# Patient Record
Sex: Male | Born: 1995 | Race: Black or African American | Hispanic: No | Marital: Single | Smoking: Never smoker
Health system: Southern US, Community
[De-identification: ages and names within clinical notes are randomized; demographics above are authoritative.]

## PROBLEM LIST (undated history)

## (undated) DIAGNOSIS — R51 Headache: Secondary | ICD-10-CM

## (undated) DIAGNOSIS — R519 Headache, unspecified: Secondary | ICD-10-CM

## (undated) DIAGNOSIS — M109 Gout, unspecified: Secondary | ICD-10-CM

---

## 2014-03-21 ENCOUNTER — Emergency Department (HOSPITAL_COMMUNITY)
Admission: EM | Admit: 2014-03-21 | Discharge: 2014-03-21 | Disposition: A | Discharge status: Home / Self Care | Payer: Federal, State, Local not specified - PPO | Source: Home / Self Care | Attending: Family Medicine | Admitting: Family Medicine

## 2014-03-21 ENCOUNTER — Encounter (HOSPITAL_COMMUNITY): Payer: Self-pay | Admitting: Emergency Medicine

## 2014-03-21 DIAGNOSIS — J029 Acute pharyngitis, unspecified: Secondary | ICD-10-CM

## 2014-03-21 LAB — POCT RAPID STREP A: Streptococcus, Group A Screen (Direct): NEGATIVE

## 2014-03-21 MED ORDER — CLINDAMYCIN HCL 300 MG PO CAPS: 300.0000 mg | ORAL_CAPSULE | Freq: Three times a day (TID) | ORAL | Status: DC

## 2014-03-21 NOTE — Discharge Instructions (Signed)
Drink lots of fluids, take all of medicine, use lozenges as needed.return if needed °

## 2014-03-21 NOTE — ED Provider Notes (Signed)
CSN: [id number]     Arrival date & time 03/21/14  1959 History   First MD Initiated Contact with Patient 03/21/14 2041     Chief Complaint  Patient presents with  . Sore Throat   (Consider location/radiation/quality/duration/timing/severity/associated sxs/prior Treatment) Patient is a 18 y.o. male presenting with pharyngitis. The history is provided by the patient.  Sore Throat This is a new problem. The current episode started 2 days ago. The problem has been gradually worsening. Pertinent negatives include no chest pain and no abdominal pain. The symptoms are aggravated by swallowing.    History reviewed. No pertinent past medical history. History reviewed. No pertinent past surgical history. History reviewed. No pertinent family history. History  Substance Use Topics  . Smoking status: Never Smoker   . Smokeless tobacco: Not on file  . Alcohol Use: No    Review of Systems  Constitutional: Negative.   HENT: Positive for sore throat.   Cardiovascular: Negative for chest pain.  Gastrointestinal: Negative for abdominal pain.  Musculoskeletal: Positive for myalgias.  Hematological: Positive for adenopathy.    Allergies  Review of patient's allergies indicates no known allergies.  Home Medications   Current Outpatient Rx  Name  Route  Sig  Dispense  Refill  . clindamycin (CLEOCIN) 300 MG capsule   Oral   Take 1 capsule (300 mg total) by mouth 3 (three) times daily.   28 capsule   0    There were no vitals taken for this visit. Physical Exam  Nursing note and vitals reviewed. Constitutional: He is oriented to person, place, and time. He appears well-developed and well-nourished. No distress.  HENT:  Right Ear: External ear normal.  Left Ear: External ear normal.  Mouth/Throat: Uvula is midline and mucous membranes are normal. Oropharyngeal exudate and posterior oropharyngeal erythema present.  Eyes: Conjunctivae are normal. Pupils are equal, round, and reactive to  light.  Neck: Normal range of motion. Neck supple.  Cardiovascular: Normal rate, regular rhythm and normal heart sounds.   Pulmonary/Chest: Effort normal and breath sounds normal.  Lymphadenopathy:    He has cervical adenopathy.  Neurological: He is alert and oriented to person, place, and time.  Skin: Skin is warm and dry.    ED Course  Procedures (including critical care time) Labs Review Labs Reviewed  POCT RAPID STREP A ([facility] ONLY)   Imaging Review No results found.   MDM   1. Pharyngitis, acute        Linna HoffJames D Chelly Dombeck, MD 03/21/14 2109

## 2014-03-21 NOTE — ED Notes (Addendum)
Exudative,red ,swollen tonsils, HA, back pain, body aches; minimal relief w motrin

## 2014-03-23 LAB — CULTURE, GROUP A STREP

## 2014-03-26 ENCOUNTER — Emergency Department (INDEPENDENT_AMBULATORY_CARE_PROVIDER_SITE_OTHER)
Admission: EM | Admit: 2014-03-26 | Discharge: 2014-03-26 | Disposition: A | Discharge status: Home / Self Care | Payer: Federal, State, Local not specified - PPO | Source: Home / Self Care

## 2014-03-26 ENCOUNTER — Encounter (HOSPITAL_COMMUNITY): Payer: Self-pay | Admitting: Emergency Medicine

## 2014-03-26 DIAGNOSIS — J039 Acute tonsillitis, unspecified: Secondary | ICD-10-CM

## 2014-03-26 DIAGNOSIS — B2790 Infectious mononucleosis, unspecified without complication: Secondary | ICD-10-CM

## 2014-03-26 LAB — POCT RAPID STREP A: STREPTOCOCCUS, GROUP A SCREEN (DIRECT): NEGATIVE

## 2014-03-26 LAB — POCT INFECTIOUS MONO SCREEN: MONO SCREEN: POSITIVE — AB

## 2014-03-26 MED ORDER — ACETAMINOPHEN-CODEINE #3 300-30 MG PO TABS: 1.0000 | ORAL_TABLET | Freq: Four times a day (QID) | ORAL | Status: DC | PRN

## 2014-03-26 MED ORDER — CEFTRIAXONE SODIUM 1 G IJ SOLR
1.0000 g | Freq: Once | INTRAMUSCULAR | Status: AC
  Administered 2014-03-26: 1 g via INTRAMUSCULAR

## 2014-03-26 MED ORDER — LIDOCAINE HCL (PF) 1 % IJ SOLN
INTRAMUSCULAR | Status: AC
  Filled 2014-03-26: qty 5

## 2014-03-26 MED ORDER — METHYLPREDNISOLONE 4 MG PO KIT: PACK | ORAL | Status: DC

## 2014-03-26 MED ORDER — CEFTRIAXONE SODIUM 1 G IJ SOLR
INTRAMUSCULAR | Status: AC
  Filled 2014-03-26: qty 10

## 2014-03-26 NOTE — ED Notes (Addendum)
Pt c/o persistent sore throat Seen here on 4/6; given Clindamycin w/no relief; tolerating well Sx include HA, night sweats, ear pain, diarrhea abd pain, white patches on bilat tonsils Denies f/v/d Alert w/no signs of acute distress.

## 2014-03-26 NOTE — ED Provider Notes (Signed)
Medical screening examination/treatment/procedure(s) were performed by a resident physician or non-physician practitioner and as the supervising physician I was immediately available for consultation/collaboration.  Darell Saputo, MD    Samson Ralph S Melora Menon, MD 03/26/14 2033 

## 2014-03-26 NOTE — ED Provider Notes (Signed)
CSN: [id number]     Arrival date & time 03/26/14  4098 History   First MD Initiated Contact with Patient 03/26/14 1120     Chief Complaint  Patient presents with  . Sore Throat   (Consider location/radiation/quality/duration/timing/severity/associated sxs/prior Treatment) HPI Comments: 18 year old male was seen in urgent care on April 6 20/15 with daily pharyngitis. History of test was negative and knee following culture was negative for beta hemolytic strep. He was treated with clindamycin and the patient states he has been compliant with 300 mg 3 times a day. He states the throat feels worse and the white patches are getting larger. He is able to swallow but it is painful. No vomiting or airway obstruction.   History reviewed. No pertinent past medical history. History reviewed. No pertinent past surgical history. No family history on file. History  Substance Use Topics  . Smoking status: Never Smoker   . Smokeless tobacco: Not on file  . Alcohol Use: No    Review of Systems  Constitutional: Positive for fever, activity change and appetite change.  HENT: Positive for sore throat and trouble swallowing.   Respiratory: Negative for cough, shortness of breath and wheezing.   Cardiovascular: Negative.   Gastrointestinal: Negative.   Skin: Negative for rash.    Allergies  Review of patient's allergies indicates no known allergies.  Home Medications   Current Outpatient Rx  Name  Route  Sig  Dispense  Refill  . clindamycin (CLEOCIN) 300 MG capsule   Oral   Take 1 capsule (300 mg total) by mouth 3 (three) times daily.   28 capsule   0   . methylPREDNISolone (MEDROL DOSEPAK) 4 MG tablet      follow package directions   21 tablet   0    BP 147/93  Pulse 88  Temp(Src) 99.7 F (37.6 C) (Oral)  Resp 18  SpO2 98% Physical Exam  Nursing note and vitals reviewed. Constitutional: He is oriented to person, place, and time. He appears well-developed and well-nourished. No  distress.  HENT:  Mouth/Throat: Oropharyngeal exudate present.  Large, beefy red bilateral palatine tonsils. Both are covered with large thick patches of dried exudate. Soft palate without edema, uvula normal size and is midline. No shifting of intraoral or pharyngeal structures. Soft palate rises symmetrically. Airway is widely patent. No visible signs of para or retropharyngeal abscess formation.  Neck: Normal range of motion. Neck supple.  Bilateral anterior cervical chain adenopathy.  Pulmonary/Chest: Effort normal. No respiratory distress.  Lymphadenopathy:    He has cervical adenopathy.  Neurological: He is alert and oriented to person, place, and time. He exhibits normal muscle tone.  Skin: Skin is warm and dry. No rash noted.  Psychiatric: He has a normal mood and affect.    ED Course  Procedures (including critical care time) Labs Review Labs Reviewed  POCT RAPID STREP A ([facility] ONLY)   Imaging Review No results found. Results for orders placed during the hospital encounter of 03/26/14  POCT RAPID STREP A ([facility] ONLY)      Result Value Ref Range   Streptococcus, Group A Screen (Direct) NEGATIVE  NEGATIVE   Results for orders placed during the hospital encounter of 03/26/14  POCT RAPID STREP A ([facility] ONLY)      Result Value Ref Range   Streptococcus, Group A Screen (Direct) NEGATIVE  NEGATIVE  POCT INFECTIOUS MONO SCREEN      Result Value Ref Range   Mono Screen  POSITIVE (*) NEGATIVE     MDM   1. Tonsillitis with exudate    monospot Consulted with Dr. Denyse Amassorey. Medrol dose pack Rocephin 1 gm IM Cont the clindamycin tid til all gone. Plenty of liquids. Return or go to the Ed if worse.    Hayden Rasmussenavid Karlo Goeden, NP 03/26/14 1151  Hayden Rasmussenavid Hubbert Landrigan, NP 03/26/14 1215  Hayden Rasmussenavid Leoda Smithhart, NP 03/26/14 [phone]

## 2014-03-26 NOTE — Discharge Instructions (Signed)
Pharyngitis Pharyngitis is redness, pain, and swelling (inflammation) of your pharynx.  CAUSES  Pharyngitis is usually caused by infection. Most of the time, these infections are from viruses (viral) and are part of a cold. However, sometimes pharyngitis is caused by bacteria (bacterial). Pharyngitis can also be caused by allergies. Viral pharyngitis may be spread from person to person by coughing, sneezing, and personal items or utensils (cups, forks, spoons, toothbrushes). Bacterial pharyngitis may be spread from person to person by more intimate contact, such as kissing.  SIGNS AND SYMPTOMS  Symptoms of pharyngitis include:   Sore throat.   Tiredness (fatigue).   Low-grade fever.   Headache.  Joint pain and muscle aches.  Skin rashes.  Swollen lymph nodes.  Plaque-like film on throat or tonsils (often seen with bacterial pharyngitis). DIAGNOSIS  Your health care provider will ask you questions about your illness and your symptoms. Your medical history, along with a physical exam, is often all that is needed to diagnose pharyngitis. Sometimes, a rapid strep test is done. Other lab tests may also be done, depending on the suspected cause.  TREATMENT  Viral pharyngitis will usually get better in 3 4 days without the use of medicine. Bacterial pharyngitis is treated with medicines that kill germs (antibiotics).  HOME CARE INSTRUCTIONS   Drink enough water and fluids to keep your urine clear or pale yellow.   Only take over-the-counter or prescription medicines as directed by your health care provider:   If you are prescribed antibiotics, make sure you finish them even if you start to feel better.   Do not take aspirin.   Get lots of rest.   Gargle with 8 oz of salt water ( tsp of salt per 1 qt of water) as often as every 1 2 hours to soothe your throat.   Throat lozenges (if you are not at risk for choking) or sprays may be used to soothe your throat. SEEK MEDICAL  CARE IF:   You have large, tender lumps in your neck.  You have a rash.  You cough up [place], yellow-brown, or bloody spit. SEEK IMMEDIATE MEDICAL CARE IF:   Your neck becomes stiff.  You drool or are unable to swallow liquids.  You vomit or are unable to keep medicines or liquids down.  You have severe pain that does not go away with the use of recommended medicines.  You have trouble breathing (not caused by a stuffy nose). MAKE SURE YOU:   Understand these instructions.  Will watch your condition.  Will get help right away if you are not doing well or get worse. Document Released: 12/02/2005 Document Revised: 09/22/2013 Document Reviewed: 08/09/2013 [facility] Patient Information 2014 [place].  Tonsillitis Tonsillitis is an infection of the throat that causes the tonsils to become red, tender, and swollen. Tonsils are collections of lymphoid tissue at the back of the throat. Each tonsil has crevices (crypts). Tonsils help fight nose and throat infections and keep infection from spreading to other parts of the body for the first 18 months of life.  CAUSES Sudden (acute) tonsillitis is usually caused by infection with streptococcal bacteria. Long-lasting (chronic) tonsillitis occurs when the crypts of the tonsils become filled with pieces of food and bacteria, which makes it easy for the tonsils to become repeatedly infected. SYMPTOMS  Symptoms of tonsillitis include:  A sore throat, with possible difficulty swallowing.  White patches on the tonsils.  Fever.  Tiredness.  New episodes of snoring during sleep, when you did  not snore before.  Small, foul-smelling, yellowish-white pieces of material (tonsilloliths) that you occasionally cough up or spit out. The tonsilloliths can also cause you to have bad breath. DIAGNOSIS Tonsillitis can be diagnosed through a physical exam. Diagnosis can be confirmed with the results of lab tests, including a throat  culture. TREATMENT  The goals of tonsillitis treatment include the reduction of the severity and duration of symptoms and prevention of associated conditions. Symptoms of tonsillitis can be improved with the use of steroids to reduce the swelling. Tonsillitis caused by bacteria can be treated with antibiotics. Usually, treatment with antibiotics is started before the cause of the tonsillitis is known. However, if it is determined that the cause is not bacterial, antibiotics will not treat the tonsillitis. If attacks of tonsillitis are severe and frequent, your caregiver may recommend surgery to remove the tonsils (tonsillectomy). HOME CARE INSTRUCTIONS   Rest as much as possible and get plenty of sleep.  Drink plenty of fluids. While the throat is very sore, eat soft foods or liquids, such as sherbet, soups, or instant breakfast drinks.  Eat frozen ice pops.  Gargle with a warm or cold liquid to help soothe the throat. Mix 1/4 teaspoon of salt and 1/4 teaspoon of baking soda in in 8 oz of water. SEEK MEDICAL CARE IF:   Large, tender lumps develop in your neck.  A rash develops.  A [place], yellow-brown, or bloody substance is coughed up.  You are unable to swallow liquids or food for 24 hours.  You notice that only one of the tonsils is swollen. SEEK IMMEDIATE MEDICAL CARE IF:   You develop any new symptoms such as vomiting, severe headache, stiff neck, chest pain, or trouble breathing or swallowing.  You have severe throat pain along with drooling or voice changes.  You have severe pain, unrelieved with recommended medications.  You are unable to fully open the mouth.  You develop redness, swelling, or severe pain anywhere in the neck.  You have a fever. MAKE SURE YOU:   Understand these instructions.  Will watch your condition.  Will get help right away if you are not doing well or get worse. Document Released: 09/11/2005 Document Revised: 08/04/2013 Document Reviewed:  05/21/2013 [facility] Patient Information 2014 [place]  Infectious Mononucleosis Infectious mononucleosis (mono) is a common germ (viral) infection in children, teenagers, and young adults.  CAUSES  Mono is an infection caused by the Malachi CarlEpstein Barr virus. The virus is spread by close personal contact with someone who has the infection. It can be passed by contact with your saliva through things such as kissing or sharing drinking glasses. Sometimes, the infection can be spread from someone who does not appear sick but still spreads the virus (asymptomatic carrier state).  SYMPTOMS  The most common symptoms of Mono are:  Sore throat.  Headache.  Fatigue.  Muscle aches.  Swollen glands.  Fever.  Poor appetite.  Enlarged liver or spleen. The less common symptoms can include:  Rash.  Feeling sick to your stomach (nauseous).  Abdominal pain. DIAGNOSIS  Mono is diagnosed by a blood test.  TREATMENT  Treatment of mono is usually at home. There is no medicine that cures this virus. Sometimes hospital treatment is needed in severe cases. Steroid medicine sometimes is needed if the swelling in the throat causes breathing or swallowing problems.  HOME CARE INSTRUCTIONS   Drink enough fluids to keep your urine clear or pale yellow.  Eat soft foods. Cool foods like popsicles  or ice cream can soothe a sore throat.  Only take over-the-counter or prescription medicines for pain, discomfort, or fever as directed by your caregiver. Children under 27 years of age should not take aspirin.  Gargle salt water. This may help relieve your sore throat. Put 1 teaspoon (tsp) of salt in 1 cup of warm water. Sucking on hard candy may also help.  Rest as needed.  Start regular activities gradually after the fever is gone. Be sure to rest when tired.  Avoid strenuous exercise or contact sports until your caregiver says it is okay. The liver and spleen could be seriously injured.  Avoid  sharing drinking glasses or kissing until your caregiver tells you that you are no longer contagious. SEEK MEDICAL CARE IF:   Your fever is not gone after 7 days.  Your activity level is not back to normal after 2 weeks.  You have yellow coloring to eyes and skin (jaundice). SEEK IMMEDIATE MEDICAL CARE IF:   You have severe pain in the abdomen or shoulder.  You have trouble swallowing or drooling.  You have trouble breathing.  You develop a stiff neck.  You develop a severe headache.  You cannot stop throwing up (vomiting).  You have convulsions.  You are confused.  You have trouble with balance.  You develop signs of body fluid loss (dehydration):  Weakness.  Sunken eyes.  Pale skin.  Dry mouth.  Rapid breathing or pulse. MAKE SURE YOU: No contact sports or similar activity for next 2 months, then recheck with your doctor  Understand these instructions.  Will watch your condition.  Will get help right away if you are not doing well or get worse. Document Released: 11/29/2000 Document Revised: 02/24/2012 Document Reviewed: 09/27/2008 [facility] Patient Information 2014 [place], [place].

## 2014-03-28 LAB — CULTURE, GROUP A STREP

## 2014-11-15 ENCOUNTER — Emergency Department (INDEPENDENT_AMBULATORY_CARE_PROVIDER_SITE_OTHER)
Admission: EM | Admit: 2014-11-15 | Discharge: 2014-11-15 | Disposition: A | Discharge status: Home / Self Care | Payer: Federal, State, Local not specified - PPO | Source: Home / Self Care | Attending: Family Medicine | Admitting: Family Medicine

## 2014-11-15 ENCOUNTER — Encounter (HOSPITAL_COMMUNITY): Payer: Self-pay | Admitting: Emergency Medicine

## 2014-11-15 DIAGNOSIS — K602 Anal fissure, unspecified: Secondary | ICD-10-CM

## 2014-11-15 DIAGNOSIS — K629 Disease of anus and rectum, unspecified: Secondary | ICD-10-CM

## 2014-11-15 MED ORDER — DILTIAZEM GEL 2 %: 1.0000 "application " | Freq: Two times a day (BID) | CUTANEOUS | Status: DC

## 2014-11-15 NOTE — ED Notes (Signed)
C/o having pain with BM.   Normal BM.  Noticed bright red blood with wiping.  Mild swelling.

## 2014-11-15 NOTE — Discharge Instructions (Signed)
Thank you for coming in today. Apply the cream twice daily Follow-up with gastroenterology   Anal Fissure, Adult An anal fissure is a small tear or crack in the skin around the anus. Bleeding from a fissure usually stops on its own within a few minutes. However, bleeding will often reoccur with each bowel movement until the crack heals.  CAUSES   Passing large, hard stools.  Frequent diarrheal stools.  Constipation.  Inflammatory bowel disease (Crohn's disease or ulcerative colitis).  Infections.  Anal sex. SYMPTOMS   Small amounts of blood seen on your stools, on toilet paper, or in the toilet after a bowel movement.  Rectal bleeding.  Painful bowel movements.  Itching or irritation around the anus. DIAGNOSIS Your caregiver will examine the anal area. An anal fissure can usually be seen with careful inspection. A rectal exam may be performed and a short tube (anoscope) may be used to examine the anal canal. TREATMENT   You may be instructed to take fiber supplements. These supplements can soften your stool to help make bowel movements easier.  Sitz baths may be recommended to help heal the tear. Do not use soap in the sitz baths.  A medicated cream or ointment may be prescribed to lessen discomfort. HOME CARE INSTRUCTIONS   Maintain a diet high in fruits, whole grains, and vegetables. Avoid constipating foods like bananas and dairy products.  Take sitz baths as directed by your caregiver.  Drink enough fluids to keep your urine clear or pale yellow.  Only take over-the-counter or prescription medicines for pain, discomfort, or fever as directed by your caregiver. Do not take aspirin as this may increase bleeding.  Do not use ointments containing numbing medications (anesthetics) or hydrocortisone. They could slow healing. SEEK MEDICAL CARE IF:   Your fissure is not completely healed within 3 days.  You have further bleeding.  You have a fever.  You have  diarrhea mixed with blood.  You have pain.  Your problem is getting worse rather than better. MAKE SURE YOU:   Understand these instructions.  Will watch your condition.  Will get help right away if you are not doing well or get worse. Document Released: 12/02/2005 Document Revised: 02/24/2012 Document Reviewed: 05/19/2011 [facility] Patient Information 2015 [place], [place]. This information is not intended to replace advice given to you by your health care provider. Make sure you discuss any questions you have with your health care provider.

## 2014-11-15 NOTE — ED Provider Notes (Signed)
Dylan [name] is a 18 y.o. male who presents to Urgent Care today for rectal pain. Patient has had a multiple month history of pain with bowel movements and with rectal bleeding. He notes bright red blood per rectum. He denies any fevers or chills or weight loss. No vomiting or diarrhea. He has not tried any medications yet. He feels well otherwise.   History reviewed. No pertinent past medical history. History reviewed. No pertinent past surgical history. History  Substance Use Topics  . Smoking status: Never Smoker   . Smokeless tobacco: Not on file  . Alcohol Use: No   ROS as above Medications: No current facility-administered medications for this encounter.   Current Outpatient Prescriptions  Medication Sig Dispense Refill  . diltiazem 2 % GEL Apply 1 application topically 2 (two) times daily. 60 g 1   Allergies  Allergen Reactions  . Clindamycin/Lincomycin Hives     Exam:  BP 147/89 mmHg  Pulse 65  Temp(Src) 98.6 F (37 C) (Oral)  Resp 12  SpO2 98% Gen: Well NAD HEENT: EOMI,  MMM Lungs: Normal work of breathing. CTABL Heart: RRR no MRG Abd: NABS, Soft. Nondistended, Nontender Exts: Brisk capillary refill, warm and well perfused.  Rectum: Normal-appearing anus. Anoscope shows a small 6:00 rectal fissure. Otherwise normal.  No results found for this or any previous visit (from the past 24 hour(s)). No results found.  Assessment and Plan: 18 y.o. male with anal fissure. Treatment with diltiazem gel. Follow up with gastroenterology.  Discussed warning signs or symptoms. Please see discharge instructions. Patient expresses understanding.     Rodolph BongEvan S Shawndell Varas, MD 11/15/14 [phone]

## 2016-04-30 ENCOUNTER — Ambulatory Visit (INDEPENDENT_AMBULATORY_CARE_PROVIDER_SITE_OTHER): Payer: Federal, State, Local not specified - PPO | Admitting: Family Medicine

## 2016-04-30 VITALS — BP 112/72 | HR 63 | Temp 97.9°F | Resp 16 | Ht 67.75 in | Wt 134.8 lb

## 2016-04-30 DIAGNOSIS — Z1329 Encounter for screening for other suspected endocrine disorder: Secondary | ICD-10-CM

## 2016-04-30 DIAGNOSIS — Z111 Encounter for screening for respiratory tuberculosis: Secondary | ICD-10-CM

## 2016-04-30 DIAGNOSIS — Z1383 Encounter for screening for respiratory disorder NEC: Secondary | ICD-10-CM | POA: Diagnosis not present

## 2016-04-30 DIAGNOSIS — Z1389 Encounter for screening for other disorder: Secondary | ICD-10-CM | POA: Diagnosis not present

## 2016-04-30 DIAGNOSIS — Z136 Encounter for screening for cardiovascular disorders: Secondary | ICD-10-CM

## 2016-04-30 DIAGNOSIS — Z130 Encounter for screening for diseases of the blood and blood-forming organs and certain disorders involving the immune mechanism: Secondary | ICD-10-CM | POA: Diagnosis not present

## 2016-04-30 DIAGNOSIS — Z0000 Encounter for general adult medical examination without abnormal findings: Secondary | ICD-10-CM | POA: Diagnosis not present

## 2016-04-30 DIAGNOSIS — Z113 Encounter for screening for infections with a predominantly sexual mode of transmission: Secondary | ICD-10-CM

## 2016-04-30 LAB — POCT URINALYSIS DIP (MANUAL ENTRY)
BILIRUBIN UA: NEGATIVE
Bilirubin, UA: NEGATIVE
Blood, UA: NEGATIVE
GLUCOSE UA: NEGATIVE
Leukocytes, UA: NEGATIVE
Nitrite, UA: NEGATIVE
PH UA: 6
SPEC GRAV UA: 1.02
Urobilinogen, UA: 0.2

## 2016-04-30 LAB — CBC
HEMATOCRIT: 47.4 % (ref 38.5–50.0)
HEMOGLOBIN: 16.1 g/dL (ref 13.2–17.1)
MCH: 33.3 pg — ABNORMAL HIGH (ref 27.0–33.0)
MCHC: 34 g/dL (ref 32.0–36.0)
MCV: 97.9 fL (ref 80.0–100.0)
MPV: 12.4 fL (ref 7.5–12.5)
Platelets: 206 10*3/uL (ref 140–400)
RBC: 4.84 MIL/uL (ref 4.20–5.80)
RDW: 13.5 % (ref 11.0–15.0)
WBC: 7.2 10*3/uL (ref 3.8–10.8)

## 2016-04-30 NOTE — Progress Notes (Signed)

## 2016-04-30 NOTE — Patient Instructions (Addendum)
   IF you received an x-ray today, you will receive an invoice from [facility]. Please contact [facility] at [phone] with questions or concerns regarding your invoice.   IF you received labwork today, you will receive an invoice from [facility]. Please contact [facility] at [phone] with questions or concerns regarding your invoice.   Our billing staff will not be able to assist you with questions regarding bills from these companies.  You will be contacted with the lab results as soon as they are available. The fastest way to get your results is to activate your My Chart account. Instructions are located on the last page of this paperwork. If you have not heard from us regarding the results in 2 weeks, please contact this office.    Keeping you healthy  Get these tests  Blood pressure- Have your blood pressure checked once a year by your healthcare provider.  Normal blood pressure is 120/80.  Weight- Have your body mass index (BMI) calculated to screen for obesity.  BMI is a measure of body fat based on height and weight. You can also calculate your own BMI at www.nhlbisupport.com/bmi/.  Cholesterol- Have your cholesterol checked regularly starting at age 35, sooner may be necessary if you have diabetes, high blood pressure, if a family member developed heart diseases at an early age or if you smoke.   Chlamydia, HIV, and other sexual transmitted disease- Get screened each year until the age of 25 then within three months of each new sexual partner.  Diabetes- Have your blood sugar checked regularly if you have high blood pressure, high cholesterol, a family history of diabetes or if you are overweight.  Get these vaccines  Flu shot- Every fall.  Tetanus shot- Every 10 years.  Menactra- Single dose; prevents meningitis.  Take these steps  Don't smoke- If you do smoke, ask your healthcare provider about quitting. For tips on  how to quit, go to www.smokefree.gov or call 1-800-QUIT-NOW.  Be physically active- Exercise 5 days a week for at least 30 minutes.  If you are not already physically active start slow and gradually work up to 30 minutes of moderate physical activity.  Examples of moderate activity include walking briskly, mowing the yard, dancing, swimming bicycling, etc.  Eat a healthy diet- Eat a variety of healthy foods such as fruits, vegetables, low fat milk, low fat cheese, yogurt, lean meats, poultry, fish, beans, tofu, etc.  For more information on healthy eating, go to www.thenutritionsource.org  Drink alcohol in moderation- Limit alcohol intake two drinks or less a day.  Never drink and drive.  Dentist- Brush and floss teeth twice daily; visit your dentis twice a year.  Depression-Your emotional health is as important as your physical health.  If you're feeling down, losing interest in things you normally enjoy please talk with your healthcare provider.  Gun Safety- If you keep a gun in your home, keep it unloaded and with the safety lock on.  Bullets should be stored separately.  Helmet use- Always wear a helmet when riding a motorcycle, bicycle, rollerblading or skateboarding.  Safe sex- If you may be exposed to a sexually transmitted infection, use a condom  Seat belts- Seat bels can save your life; always wear one.  Smoke/Carbon Monoxide detectors- These detectors need to be installed on the appropriate level of your home.  Replace batteries at least once a year.  Skin Cancer- When out in the sun, cover up and use sunscreen SPF   15 or higher.  Violence- If anyone is threatening or hurting you, please tell your healthcare provider. 

## 2016-04-30 NOTE — Progress Notes (Signed)
Subjective:  By signing my name below, I, Stann Oresung-Kai Tsai, attest that this documentation has been prepared under the direction and in the presence of Norberto SorensonEva Jonnie Kubly, MD. Electronically Signed: Stann Oresung-Kai Tsai, Scribe. 04/30/2016 , 1:56 PM .  Patient was seen in Room 10 .   Patient ID: Dylan [name], male    DOB: 1996-01-12, 20 y.o.   MRN: [id number] Chief Complaint  Patient presents with  . Annual Exam   HPI Dylan [name] is a 20 y.o. male who [facility]. He's been fasting today.  He's generally healthy. He denies taking chronic medications.   Seasonal Allergies He's taken benadryl at night for his seasonal allergies.   Family History His mother has diabetes and HTN.  His maternal grandfather had prostate cancer, diagnosed around 22 years old.   Immunizations He received his tetanus shot about a year ago because he works around children.  He believes he received his gardasil vaccinations.   Work He works as a Runner, broadcasting/film/videoteacher at [employer].  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   History reviewed. No pertinent past medical history. Prior to Admission medications   Medication Sig Start Date End Date Taking? Authorizing Provider  diltiazem 2 % GEL Apply 1 application topically 2 (two) times daily. Patient not taking: Reported on 04/30/2016 11/15/14   Rodolph BongEvan S Corey, MD   Allergies  Allergen Reactions  . Clindamycin/Lincomycin Hives   History reviewed. No pertinent past surgical history. Family History  Problem Relation Age of Onset  . Hypertension Mother   . Diabetes Mother    Social History   Social History  . Marital Status: Single    Spouse Name: N/A  . Number of Children: N/A  . Years of Education: N/A   Social History Main Topics  . Smoking status: Never Smoker   . Smokeless tobacco: None  . Alcohol Use: No  . Drug Use: No  . Sexual Activity: Yes    Birth Control/ Protection: Condom   Other Topics Concern  .  None   Social History Narrative     Review of Systems  Constitutional: Negative for fever, chills and fatigue.  HENT: Negative for congestion, rhinorrhea and sore throat.   Respiratory: Negative for cough, shortness of breath and wheezing.   Gastrointestinal: Negative for nausea, vomiting and diarrhea.  Allergic/Immunologic: Positive for environmental allergies.  All other systems reviewed and are negative.     Objective:   Physical Exam  Constitutional: He is oriented to person, place, and time. He appears well-developed and well-nourished. No distress.  HENT:  Head: Normocephalic and atraumatic.  Some postnasal drip noticed  Eyes: EOM are normal. Pupils are equal, round, and reactive to light.  Neck: Neck supple.  Cardiovascular: Normal rate, regular rhythm and normal heart sounds.   Pulmonary/Chest: Effort normal and breath sounds normal. No respiratory distress. He has no wheezes.  Abdominal: Bowel sounds are normal.  Musculoskeletal: Normal range of motion.  Neurological: He is alert and oriented to person, place, and time.  Reflex Scores:      Patellar reflexes are 2+ on the right side and 2+ on the left side.      Achilles reflexes are 2+ on the right side and 2+ on the left side. Skin: Skin is warm and dry.  Psychiatric: He has a normal mood and affect. His behavior is normal.  Nursing note and vitals reviewed.  BP 112/72 mmHg  Pulse 63  Temp(Src)  97.9 F (36.6 C) (Oral)  Resp 16  Ht 5' 7.75" (1.721 m)  Wt 134 lb 12.8 oz (61.145 kg)  BMI 20.64 kg/m2  SpO2 100%    Assessment & Plan:   1. Annual physical exam   2. Routine screening for STI (sexually transmitted infection)   3. Screening for cardiovascular, respiratory, and genitourinary diseases   4. Screening for deficiency anemia   5. Screening for thyroid disorder   6. Visit for TB skin test     Orders Placed This Encounter  Procedures  . GC/Chlamydia Probe Amp  . CBC  . Comprehensive metabolic  panel    Order Specific Question:  Has the patient fasted?    Answer:  Yes  . TSH  . Lipid panel    Order Specific Question:  Has the patient fasted?    Answer:  Yes  . HIV antibody  . RPR  . Hepatitis C Antibody  . POCT urinalysis dipstick  . TB Skin Test    Order Specific Question:  Has patient ever tested positive?    Answer:  No     I personally performed the services described in this documentation, which was scribed in my presence. The recorded information has been reviewed and considered, and addended by me as needed.   Norberto Sorenson, M.D.  [facility] [address] [place], [place] [address] [phone] phone [phone] fax  05/17/2016 10:23 AM

## 2016-05-01 LAB — COMPREHENSIVE METABOLIC PANEL
ALBUMIN: 5.1 g/dL (ref 3.6–5.1)
ALK PHOS: 60 U/L (ref 40–115)
ALT: 10 U/L (ref 9–46)
AST: 16 U/L (ref 10–40)
BILIRUBIN TOTAL: 0.6 mg/dL (ref 0.2–1.2)
BUN: 16 mg/dL (ref 7–25)
CO2: 26 mmol/L (ref 20–31)
Calcium: 10.1 mg/dL (ref 8.6–10.3)
Chloride: 102 mmol/L (ref 98–110)
Creat: 0.97 mg/dL (ref 0.60–1.35)
GLUCOSE: 80 mg/dL (ref 65–99)
Potassium: 5.1 mmol/L (ref 3.5–5.3)
Sodium: 139 mmol/L (ref 135–146)
Total Protein: 7.5 g/dL (ref 6.1–8.1)

## 2016-05-01 LAB — LIPID PANEL
CHOLESTEROL: 210 mg/dL — AB (ref 125–200)
HDL: 82 mg/dL (ref >=40)
LDL Cholesterol: 121 mg/dL (ref <130)
Total CHOL/HDL Ratio: 2.6 Ratio (ref <=5.0)
Triglycerides: 33 mg/dL (ref <150)
VLDL: 7 mg/dL (ref <30)

## 2016-05-01 LAB — HEPATITIS C ANTIBODY: HCV AB: NEGATIVE

## 2016-05-01 LAB — GC/CHLAMYDIA PROBE AMP
CT PROBE, AMP APTIMA: NOT DETECTED
GC PROBE AMP APTIMA: NOT DETECTED

## 2016-05-01 LAB — HIV ANTIBODY (ROUTINE TESTING W REFLEX): HIV 1&2 Ab, 4th Generation: NONREACTIVE

## 2016-05-01 LAB — RPR

## 2016-05-01 LAB — TSH: TSH: 1.36 mIU/L (ref 0.40–4.50)

## 2016-05-03 ENCOUNTER — Ambulatory Visit (INDEPENDENT_AMBULATORY_CARE_PROVIDER_SITE_OTHER): Payer: Federal, State, Local not specified - PPO | Admitting: Radiology

## 2016-05-03 ENCOUNTER — Encounter: Payer: Self-pay | Admitting: Family Medicine

## 2016-05-03 DIAGNOSIS — Z111 Encounter for screening for respiratory tuberculosis: Secondary | ICD-10-CM

## 2016-05-03 LAB — TB SKIN TEST
Induration: 0 mm
TB Skin Test: NEGATIVE

## 2017-11-21 ENCOUNTER — Encounter (HOSPITAL_COMMUNITY): Payer: Self-pay

## 2017-11-21 ENCOUNTER — Emergency Department (HOSPITAL_COMMUNITY)
Admission: EM | Admit: 2017-11-21 | Discharge: 2017-11-21 | Disposition: A | Discharge status: Home / Self Care | Payer: Federal, State, Local not specified - PPO | Attending: Emergency Medicine | Admitting: Emergency Medicine

## 2017-11-21 ENCOUNTER — Other Ambulatory Visit: Payer: Self-pay

## 2017-11-21 DIAGNOSIS — H6692 Otitis media, unspecified, left ear: Secondary | ICD-10-CM | POA: Insufficient documentation

## 2017-11-21 DIAGNOSIS — H6690 Otitis media, unspecified, unspecified ear: Secondary | ICD-10-CM

## 2017-11-21 MED ORDER — AMOXICILLIN 500 MG PO CAPS
500.0000 mg | ORAL_CAPSULE | Freq: Two times a day (BID) | ORAL | 0 refills | Status: AC
Start: 2017-11-21 — End: 2017-11-28

## 2017-11-21 NOTE — ED Triage Notes (Signed)
Pt here for sinus drainage, and sore throat and ear pain on left side of face.

## 2017-11-21 NOTE — Discharge Instructions (Signed)
Please read and follow all provided instructions.  Your diagnoses today include:  1. Acute otitis media, unspecified otitis media type     Tests performed today include: Vital signs. See below for your results today.   Medications prescribed:  Take as prescribed   Home care instructions:  Follow any educational materials contained in this packet.  Follow-up instructions: Please follow-up with your primary care provider for further evaluation of symptoms and treatment   Return instructions:  Please return to the Emergency Department if you do not get better, if you get worse, or new symptoms OR  - Fever (temperature greater than 101.65F)  - Bleeding that does not stop with holding pressure to the area    -Severe pain (please note that you may be more sore the day after your accident)  - Chest Pain  - Difficulty breathing  - Severe nausea or vomiting  - Inability to tolerate food and liquids  - Passing out  - Skin becoming red around your wounds  - Change in mental status (confusion or lethargy)  - New numbness or weakness    Please return if you have any other emergent concerns.  Additional Information:  Your vital signs today were: BP (!) 145/95 (BP Location: Right Arm)    Pulse 68    Temp 98.2 F (36.8 C) (Oral)    Resp 18    SpO2 100%  If your blood pressure (BP) was elevated above 135/85 this visit, please have this repeated by your doctor within one month. ---------------

## 2017-11-21 NOTE — ED Provider Notes (Signed)
[facility] Provider Note   CSN: [id number] Arrival date & time: 11/21/17  0422     History   Chief Complaint Chief Complaint  Patient presents with  . Otalgia  . Oral Swelling    HPI Dylan [name] is a 21 y.o. male.  HPI  21 y.o. male, presents to the Emergency Department today due to sore throat, sinus drainage and left sided facial pain x 1 week. Noted ear pain. No fevers. Notes pain 3/10. Dull ache in left ear. No cough/congestion. No rhinorrhea. OTC meds with minimal relief. No other symptoms noted    History reviewed. No pertinent past medical history.  There are no active problems to display for this patient.   History reviewed. No pertinent surgical history.     Home Medications    Prior to Admission medications   Not on File    Family History Family History  Problem Relation Age of Onset  . Hypertension Mother   . Diabetes Mother     Social History Social History   Tobacco Use  . Smoking status: Never Smoker  Substance Use Topics  . Alcohol use: No  . Drug use: No     Allergies   Clindamycin/lincomycin   Review of Systems Review of Systems ROS reviewed and all are negative for acute change except as noted in the HPI.  Physical Exam Updated Vital Signs BP (!) 145/95 (BP Location: Right Arm)   Pulse 68   Temp 98.2 F (36.8 C) (Oral)   Resp 18   SpO2 100%   Physical Exam  Constitutional: He is oriented to person, place, and time. Vital signs are normal. He appears well-developed and well-nourished. No distress.  HENT:  Head: Normocephalic and atraumatic.  Right Ear: Hearing, tympanic membrane, external ear and ear canal normal.  Left Ear: Hearing, external ear and ear canal normal. Tympanic membrane is injected.  Nose: Nose normal.  Mouth/Throat: Uvula is midline, oropharynx is clear and moist and mucous membranes are normal. No trismus in the jaw. No oropharyngeal exudate, posterior  oropharyngeal erythema or tonsillar abscesses.  Eyes: Conjunctivae and EOM are normal. Pupils are equal, round, and reactive to light.  Neck: Normal range of motion. Neck supple. No tracheal deviation present.  Cardiovascular: Normal rate, regular rhythm, S1 normal, S2 normal, normal heart sounds, intact distal pulses and normal pulses.  Pulmonary/Chest: Effort normal and breath sounds normal. No respiratory distress. He has no decreased breath sounds. He has no wheezes. He has no rhonchi. He has no rales.  Abdominal: Normal appearance and bowel sounds are normal. There is no tenderness.  Musculoskeletal: Normal range of motion.  Neurological: He is alert and oriented to person, place, and time.  Skin: Skin is warm and dry.  Psychiatric: He has a normal mood and affect. His speech is normal and behavior is normal. Thought content normal.  Vitals reviewed.    ED Treatments / Results  Labs (all labs ordered are listed, but only abnormal results are displayed) Labs Reviewed - No data to display  EKG  EKG Interpretation None       Radiology No results found.  Procedures Procedures (including critical care time)  Medications Ordered in ED Medications - No data to display   Initial Impression / Assessment and Plan / ED Course  I have reviewed the triage vital signs and the nursing notes.  Pertinent labs & imaging results that were available during my care of the patient were reviewed by me  and considered in my medical decision making (see chart for details).  Final Clinical Impressions(s) / ED Diagnoses     {I have reviewed the relevant previous healthcare records.  {I obtained HPI from historian.   ED Course:  Assessment: Pt is a 21 y.o. male  presents to the Emergency Department today due to sore throat, sinus drainage and left sided facial pain x 1 week. Noted ear pain. No fevers. Notes pain 3/10. Dull ache in left ear. No cough/congestion. No rhinorrhea. OTC meds with  minimal relief. On exam, pt in NAD. Nontoxic/nonseptic appearing. VSS. Afebrile. Lungs CTA. Heart RRR. Abdomen nontender soft. Left TM erythematous. No mastoid tenderness. Given ABX for Otitis Media. Plan is to DC home. At time of discharge, Patient is in no acute distress. Vital Signs are stable. Patient is able to ambulate. Patient able to tolerate PO.    Disposition/Plan:  DC Home Additional Verbal discharge instructions given and discussed with patient.  Pt Instructed to f/u with PCP in the next week for evaluation and treatment of symptoms. Return precautions given Pt acknowledges and agrees with plan  Supervising Physician Rolland PorterJames, Mark, MD  Final diagnoses:  Acute otitis media, unspecified otitis media type    ED Discharge Orders    None       Audry PiliMohr, Zamiah Tollett, PA-C 11/21/17 [id number]    Rolland PorterJames, Mark, MD 11/26/17 1527

## 2017-12-08 ENCOUNTER — Encounter (HOSPITAL_COMMUNITY): Payer: Self-pay | Admitting: Emergency Medicine

## 2017-12-08 ENCOUNTER — Ambulatory Visit (INDEPENDENT_AMBULATORY_CARE_PROVIDER_SITE_OTHER): Payer: Self-pay

## 2017-12-08 ENCOUNTER — Ambulatory Visit (HOSPITAL_COMMUNITY)
Admission: EM | Admit: 2017-12-08 | Discharge: 2017-12-08 | Disposition: A | Discharge status: Home / Self Care | Payer: Self-pay | Attending: Internal Medicine | Admitting: Internal Medicine

## 2017-12-08 DIAGNOSIS — M109 Gout, unspecified: Secondary | ICD-10-CM

## 2017-12-08 DIAGNOSIS — R2242 Localized swelling, mass and lump, left lower limb: Secondary | ICD-10-CM

## 2017-12-08 DIAGNOSIS — M79675 Pain in left toe(s): Secondary | ICD-10-CM

## 2017-12-08 MED ORDER — INDOMETHACIN 50 MG PO CAPS: ORAL_CAPSULE | ORAL | 0 refills | Status: AC

## 2017-12-08 NOTE — ED Triage Notes (Signed)
PT C/O: left foot swelling and pain on medial part of greater toe  ONSET: 3 days  DENIES: inj/trauma  TAKING MEDS: ibup and ice   A&O x4... NAD... Ambulatory

## 2017-12-08 NOTE — Discharge Instructions (Signed)
Drink plenty of water. Indomethacin three times a day, take with food, for three days. Then decrease to twice a day, take with food, until symptoms resolve. Do not take additional ibuprofen. May take tylenol additionally. Ice and elevate. If symptoms worsen or do not improve in the next week to return to be seen or to follow up with PCP.

## 2017-12-08 NOTE — ED Provider Notes (Signed)
[facility]    CSN: [id number] Arrival date & time: 12/08/17  1401     History   Chief Complaint Chief Complaint  Patient presents with  . Foot Pain    HPI Dylan [name] is a 21 y.o. male.   Dylan [name] presents with complaints of left great toe MTP joint pain which started two days ago. No injury or trauma to the foot. Ibuprofen did help, did not take any today. Yesterday did ice and elevate. Now with redness and swelling. Without history of kidney stones or gout. Does have an aunt who gets gout. 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 Without numbness or tingling to the foot or toe. Pain is 9/10, worse if touched to the area. Denies recent alcohol intake or other dehydration.   ROS per HPI.       History reviewed. No pertinent past medical history.  There are no active problems to display for this patient.   History reviewed. No pertinent surgical history.     Home Medications    Prior to Admission medications   Medication Sig Start Date End Date Taking? Authorizing Provider  indomethacin (INDOCIN) 50 MG capsule Take 1 capsule (50 mg total) by mouth 3 (three) times daily with meals for 3 days, THEN 1 capsule (50 mg total) 2 (two) times daily with a meal for 7 days. 12/08/17 12/18/17  Georgetta HaberBurky, Natalie B, NP    Family History Family History  Problem Relation Age of Onset  . Hypertension Mother   . Diabetes Mother     Social History Social History   Tobacco Use  . Smoking status: Never Smoker  . Smokeless tobacco: Never Used  Substance Use Topics  . Alcohol use: No  . Drug use: No     Allergies   Clindamycin/lincomycin   Review of Systems Review of Systems   Physical Exam Triage Vital Signs ED Triage Vitals  Enc Vitals Group     BP 12/08/17 1514 (!) 147/67     Pulse Rate 12/08/17 1514 62     Resp 12/08/17 1514 20     Temp 12/08/17 1514 98.2 F (36.8 C)     Temp Source 12/08/17 1514 Oral   SpO2 12/08/17 1514 100 %     Weight --      Height --      Head Circumference --      Peak Flow --      Pain Score 12/08/17 1512 9     Pain Loc --      Pain Edu? --      Excl. in GC? --    No data found.  Updated Vital Signs BP (!) 147/67 (BP Location: Left Arm)   Pulse 62   Temp 98.2 F (36.8 C) (Oral)   Resp 20   SpO2 100%   Visual Acuity Right Eye Distance:   Left Eye Distance:   Bilateral Distance:    Right Eye Near:   Left Eye Near:    Bilateral Near:     Physical Exam  Constitutional: He is oriented to person, place, and time. He appears well-developed and well-nourished.  Cardiovascular: Normal rate and regular rhythm.  Pulmonary/Chest: Effort normal and breath sounds normal.  Musculoskeletal:       Left foot: There is tenderness, bony tenderness and swelling. There is normal range of motion, normal capillary refill, no crepitus, no deformity and no laceration.  Feet:  Left toe MTP joint with pain, swelling and redness; full rom but limited by pain; sensation intact; without open skin, lesion or breakdown; without bruising; strong pedal pulse.  Neurological: He is alert and oriented to person, place, and time.  Skin: Skin is warm and dry.     UC Treatments / Results  Labs (all labs ordered are listed, but only abnormal results are displayed) Labs Reviewed - No data to display  EKG  EKG Interpretation None       Radiology Dg Foot Complete Left  Result Date: 12/08/2017 CLINICAL DATA:  Pain and swelling in the great toe EXAM: LEFT FOOT - COMPLETE 3+ VIEW COMPARISON:  None. FINDINGS: No fracture or malalignment. No periostitis or bone destruction. Soft tissue edema. IMPRESSION: Soft tissue swelling.  No definite acute osseous abnormality. Electronically Signed   By: Jasmine PangKim  Fujinaga M.D.   On: 12/08/2017 16:15    Procedures Procedures (including critical care time)  Medications Ordered in UC Medications - No data to display   Initial  Impression / Assessment and Plan / UC Course  I have reviewed the triage vital signs and the nursing notes.  Pertinent labs & imaging results that were available during my care of the patient were reviewed by me and considered in my medical decision making (see chart for details).     Patient requests xray today despite lack of trauma. Negative for acute findings. History and physical consistent with gout at this time. Indomethacin tid with food. Ice and elevate. If symptoms worsen or do not improve in the next week to return to be seen or to follow up with PCP.  Patient verbalized understanding and agreeable to plan.    Final Clinical Impressions(s) / UC Diagnoses   Final diagnoses:  Acute gout involving toe of left foot, unspecified cause    ED Discharge Orders        Ordered    indomethacin (INDOCIN) 50 MG capsule     12/08/17 1620       Controlled Substance Prescriptions [place] Controlled Substance Registry consulted? Not Applicable   Georgetta HaberBurky, Natalie B, NP 12/08/17 1622

## 2017-12-25 ENCOUNTER — Encounter (HOSPITAL_COMMUNITY): Payer: Self-pay | Admitting: Emergency Medicine

## 2017-12-25 ENCOUNTER — Ambulatory Visit (HOSPITAL_COMMUNITY)
Admission: EM | Admit: 2017-12-25 | Discharge: 2017-12-25 | Disposition: A | Discharge status: Home / Self Care | Payer: BC Managed Care – PPO | Attending: Family Medicine | Admitting: Family Medicine

## 2017-12-25 ENCOUNTER — Other Ambulatory Visit: Payer: Self-pay

## 2017-12-25 DIAGNOSIS — M79672 Pain in left foot: Secondary | ICD-10-CM | POA: Diagnosis not present

## 2017-12-25 MED ORDER — PREDNISONE 10 MG (21) PO TBPK: ORAL_TABLET | ORAL | 0 refills | Status: DC

## 2017-12-25 NOTE — ED Provider Notes (Signed)
  [facility]   [id number] 12/25/17 Arrival Time: 1105  ASSESSMENT & PLAN:  1. Foot pain, left     Meds ordered this encounter  Medications  . predniSONE (STERAPRED UNI-PAK 21 TAB) 10 MG (21) TBPK tablet    Sig: Take as directed.    Dispense:  21 tablet    Refill:  0   With no bony tenderness I do not see a reason to image again tonight. Will treat as inflammatory in nature. WBAT.  Will f/u if not seeing improvement over the next week, sooner if needed.  Reviewed expectations re: course of current medical issues. Questions answered. Outlined signs and symptoms indicating need for more acute intervention. Patient verbalized understanding. After Visit Summary given.  SUBJECTIVE: History from: patient. Camillia Herteraron Schnetzer is a 22 y.o. male who reports intermittent pain of his left foot, specifically his plantar foot distally just proximal to great toe. Treated for gout a few weeks ago. Initial relief but "still sore." Bearing weight exacerbates. Using crutches to help. No swelling or redness reported. No extremity sensation changes or weakness. Currently not taking any medications for discomfort. No injury/trauma.  ROS: As per HPI.   OBJECTIVE:  Vitals:   12/25/17 1150  BP: 136/66  Pulse: (!) 59  Resp: 18  Temp: 98 F (36.7 C)  SpO2: 100%    General appearance: alert; no distress Extremities: no cyanosis or edema; symmetrical with no gross deformities; tenderness over his left plantar foot just proximal to great toe with no swelling and no bruising; ROM: normal CV: normal extremity capillary refill Skin: warm and dry Neurologic: normal gait; normal symmetric reflexes in all extremities; normal sensation in all extremities Psychological: alert and cooperative; normal mood and affect    Allergies  Allergen Reactions  . Clindamycin/Lincomycin Hives     Social History   Socioeconomic History  . Marital status: Single    Spouse name: Not on file  . Number of  children: Not on file  . Years of education: Not on file  . Highest education level: Not on file  Social Needs  . Financial resource strain: Not on file  . Food insecurity - worry: Not on file  . Food insecurity - inability: Not on file  . Transportation needs - medical: Not on file  . Transportation needs - non-medical: Not on file  Occupational History  . Not on file  Tobacco Use  . Smoking status: Never Smoker  . Smokeless tobacco: Never Used  Substance and Sexual Activity  . Alcohol use: No  . Drug use: No  . Sexual activity: Yes    Birth control/protection: Condom  Other Topics Concern  . Not on file  Social History Narrative  . Not on file   Family History  Problem Relation Age of Onset  . Hypertension Mother   . Diabetes Mother    History reviewed. No pertinent surgical history.   Mardella LaymanHagler, Kristan Brummitt, MD 12/30/17 [id number]

## 2017-12-25 NOTE — ED Triage Notes (Signed)
Pt states "I had gout last time I was here on christmas eve, I took the medication they gave me, did everything was supposed to do, but on the bottom toe its still swollen and its painful and I cant walk on it." Pt had xrays last time he was here without abnormalities.

## 2017-12-25 NOTE — Discharge Instructions (Signed)
Return in one week if not improving. Weight bearing as tolerated.

## 2018-06-01 ENCOUNTER — Ambulatory Visit: Payer: Self-pay | Admitting: Otolaryngology

## 2018-06-01 NOTE — H&P (Signed)
PREOPERATIVE H&P  Chief Complaint: frequent tonsil infections  HPI: Dylan [name] is a 22 y.o. male who presents for evaluation of chronic tonsil infections. He has had 5 or 6 rounds of antibiotics this past year for recurrent tonsil infections. When he gets tonsil infections he has a severe sore throat and trouble swallowing. He has always had large tonsils. He is otherwise healthy  No past medical history on file. No past surgical history on file. Social History   Socioeconomic History  . Marital status: Single    Spouse name: Not on file  . Number of children: Not on file  . Years of education: Not on file  . Highest education level: Not on file  Occupational History  . Not on file  Social Needs  . Financial resource strain: Not on file  . Food insecurity:    Worry: Not on file    Inability: Not on file  . Transportation needs:    Medical: Not on file    Non-medical: Not on file  Tobacco Use  . Smoking status: Never Smoker  . Smokeless tobacco: Never Used  Substance and Sexual Activity  . Alcohol use: No  . Drug use: No  . Sexual activity: Yes    Birth control/protection: Condom  Lifestyle  . Physical activity:    Days per week: Not on file    Minutes per session: Not on file  . Stress: Not on file  Relationships  . Social connections:    Talks on phone: Not on file    Gets together: Not on file    Attends religious service: Not on file    Active member of club or organization: Not on file    Attends meetings of clubs or organizations: Not on file    Relationship status: Not on file  Other Topics Concern  . Not on file  Social History Narrative  . Not on file   Family History  Problem Relation Age of Onset  . Hypertension Mother   . Diabetes Mother    Allergies  Allergen Reactions  . Clindamycin/Lincomycin Hives   Prior to Admission medications   Medication Sig Start Date End Date Taking? Authorizing Provider  predniSONE (STERAPRED UNI-PAK 21 TAB) 10  MG (21) TBPK tablet Take as directed. 12/25/17   Mardella LaymanHagler, Brian, MD     Positive ROS: per history of present illness  All other systems have been reviewed and were otherwise negative with the exception of those mentioned in the HPI and as above.  Physical Exam: There were no vitals filed for this visit.  General: Alert, no acute distress Oral: Normal oral mucosa and 3+ tonsils Nasal: Clear nasal passages Neck: No palpable adenopathy or thyroid nodules Ear: Ear canal is clear with normal appearing TMs Cardiovascular: Regular rate and rhythm, no murmur.  Respiratory: Clear to auscultation Neurologic: Alert and oriented x 3   Assessment/Plan: CHRONIC TONSILITIS Plan for Procedure(s): TONSILLECTOMY   Dillard Cannonhristopher Jorene Kaylor, MD 06/01/2018 1:56 PM

## 2018-06-02 ENCOUNTER — Other Ambulatory Visit: Payer: Self-pay

## 2018-06-02 ENCOUNTER — Encounter (HOSPITAL_BASED_OUTPATIENT_CLINIC_OR_DEPARTMENT_OTHER): Payer: Self-pay | Admitting: *Deleted

## 2018-06-09 ENCOUNTER — Encounter (HOSPITAL_BASED_OUTPATIENT_CLINIC_OR_DEPARTMENT_OTHER): Payer: Self-pay | Admitting: *Deleted

## 2018-06-09 ENCOUNTER — Other Ambulatory Visit: Payer: Self-pay

## 2018-06-09 ENCOUNTER — Ambulatory Visit (HOSPITAL_BASED_OUTPATIENT_CLINIC_OR_DEPARTMENT_OTHER): Payer: BC Managed Care – PPO | Admitting: Anesthesiology

## 2018-06-09 ENCOUNTER — Encounter (HOSPITAL_BASED_OUTPATIENT_CLINIC_OR_DEPARTMENT_OTHER)
Admission: RE | Disposition: A | Discharge status: Home / Self Care | Payer: Self-pay | Source: Ambulatory Visit | Attending: Otolaryngology

## 2018-06-09 ENCOUNTER — Ambulatory Visit (HOSPITAL_BASED_OUTPATIENT_CLINIC_OR_DEPARTMENT_OTHER)
Admission: RE | Admit: 2018-06-09 | Discharge: 2018-06-09 | Disposition: A | Discharge status: Home / Self Care | Payer: BC Managed Care – PPO | Source: Ambulatory Visit | Attending: Otolaryngology | Admitting: Otolaryngology

## 2018-06-09 DIAGNOSIS — Z880 Allergy status to penicillin: Secondary | ICD-10-CM | POA: Diagnosis not present

## 2018-06-09 DIAGNOSIS — Z833 Family history of diabetes mellitus: Secondary | ICD-10-CM | POA: Diagnosis not present

## 2018-06-09 DIAGNOSIS — J3501 Chronic tonsillitis: Secondary | ICD-10-CM | POA: Diagnosis not present

## 2018-06-09 DIAGNOSIS — M109 Gout, unspecified: Secondary | ICD-10-CM | POA: Diagnosis not present

## 2018-06-09 DIAGNOSIS — R51 Headache: Secondary | ICD-10-CM | POA: Insufficient documentation

## 2018-06-09 DIAGNOSIS — Z8249 Family history of ischemic heart disease and other diseases of the circulatory system: Secondary | ICD-10-CM | POA: Diagnosis not present

## 2018-06-09 HISTORY — DX: Headache: R51

## 2018-06-09 HISTORY — DX: Gout, unspecified: M10.9

## 2018-06-09 HISTORY — PX: TONSILLECTOMY: SHX5217

## 2018-06-09 HISTORY — DX: Headache, unspecified: R51.9

## 2018-06-09 SURGERY — TONSILLECTOMY
Anesthesia: General

## 2018-06-09 MED ORDER — MIDAZOLAM HCL 2 MG/2ML IJ SOLN: 1.0000 mg | INTRAMUSCULAR | Status: DC | PRN

## 2018-06-09 MED ORDER — SUCCINYLCHOLINE CHLORIDE 200 MG/10ML IV SOSY
PREFILLED_SYRINGE | INTRAVENOUS | Status: AC
  Filled 2018-06-09: qty 10

## 2018-06-09 MED ORDER — LIDOCAINE 2% (20 MG/ML) 5 ML SYRINGE
INTRAMUSCULAR | Status: DC | PRN
  Administered 2018-06-09: 60 mg via INTRAVENOUS

## 2018-06-09 MED ORDER — OXYCODONE HCL 5 MG PO TABS: 5.0000 mg | ORAL_TABLET | Freq: Once | ORAL | Status: DC | PRN

## 2018-06-09 MED ORDER — PROPOFOL 10 MG/ML IV BOLUS
INTRAVENOUS | Status: DC | PRN
  Administered 2018-06-09: 200 mg via INTRAVENOUS

## 2018-06-09 MED ORDER — FENTANYL CITRATE (PF) 100 MCG/2ML IJ SOLN
25.0000 ug | INTRAMUSCULAR | Status: DC | PRN
  Administered 2018-06-09: 25 ug via INTRAVENOUS
  Administered 2018-06-09: 50 ug via INTRAVENOUS
  Administered 2018-06-09: 25 ug via INTRAVENOUS

## 2018-06-09 MED ORDER — FENTANYL CITRATE (PF) 100 MCG/2ML IJ SOLN: 50.0000 ug | INTRAMUSCULAR | Status: DC | PRN

## 2018-06-09 MED ORDER — FENTANYL CITRATE (PF) 100 MCG/2ML IJ SOLN
INTRAMUSCULAR | Status: AC
  Filled 2018-06-09: qty 2

## 2018-06-09 MED ORDER — ONDANSETRON HCL 4 MG/2ML IJ SOLN
INTRAMUSCULAR | Status: AC
  Filled 2018-06-09: qty 2

## 2018-06-09 MED ORDER — CHLORHEXIDINE GLUCONATE CLOTH 2 % EX PADS: 6.0000 | MEDICATED_PAD | Freq: Once | CUTANEOUS | Status: DC

## 2018-06-09 MED ORDER — PROMETHAZINE HCL 25 MG/ML IJ SOLN: 6.2500 mg | INTRAMUSCULAR | Status: DC | PRN

## 2018-06-09 MED ORDER — CEFAZOLIN SODIUM-DEXTROSE 2-4 GM/100ML-% IV SOLN
2.0000 g | INTRAVENOUS | Status: AC
  Administered 2018-06-09: 2 g via INTRAVENOUS

## 2018-06-09 MED ORDER — HYDROCODONE-ACETAMINOPHEN 7.5-325 MG/15ML PO SOLN
10.0000 mL | Freq: Four times a day (QID) | ORAL | 0 refills | Status: AC | PRN
Start: 2018-06-09 — End: 2019-06-09

## 2018-06-09 MED ORDER — CEFAZOLIN SODIUM-DEXTROSE 2-4 GM/100ML-% IV SOLN
INTRAVENOUS | Status: AC
  Filled 2018-06-09: qty 100

## 2018-06-09 MED ORDER — DEXAMETHASONE SODIUM PHOSPHATE 10 MG/ML IJ SOLN
INTRAMUSCULAR | Status: AC
  Filled 2018-06-09: qty 1

## 2018-06-09 MED ORDER — MIDAZOLAM HCL 5 MG/5ML IJ SOLN
INTRAMUSCULAR | Status: DC | PRN
  Administered 2018-06-09: 2 mg via INTRAVENOUS

## 2018-06-09 MED ORDER — PROPOFOL 10 MG/ML IV BOLUS
INTRAVENOUS | Status: AC
  Filled 2018-06-09: qty 20

## 2018-06-09 MED ORDER — FENTANYL CITRATE (PF) 100 MCG/2ML IJ SOLN
INTRAMUSCULAR | Status: DC | PRN
  Administered 2018-06-09: 100 ug via INTRAVENOUS

## 2018-06-09 MED ORDER — ROCURONIUM BROMIDE 100 MG/10ML IV SOLN
INTRAVENOUS | Status: DC | PRN
  Administered 2018-06-09: 10 mg via INTRAVENOUS

## 2018-06-09 MED ORDER — SCOPOLAMINE 1 MG/3DAYS TD PT72: 1.0000 | MEDICATED_PATCH | Freq: Once | TRANSDERMAL | Status: DC | PRN

## 2018-06-09 MED ORDER — MIDAZOLAM HCL 2 MG/2ML IJ SOLN
INTRAMUSCULAR | Status: AC
  Filled 2018-06-09: qty 2

## 2018-06-09 MED ORDER — ROCURONIUM BROMIDE 10 MG/ML (PF) SYRINGE
PREFILLED_SYRINGE | INTRAVENOUS | Status: AC
  Filled 2018-06-09: qty 10

## 2018-06-09 MED ORDER — AZITHROMYCIN 200 MG/5ML PO SUSR: 200.0000 mg | Freq: Every day | ORAL | 0 refills | Status: AC

## 2018-06-09 MED ORDER — LACTATED RINGERS IV SOLN
INTRAVENOUS | Status: DC
  Administered 2018-06-09: 07:00:00 via INTRAVENOUS

## 2018-06-09 MED ORDER — DEXAMETHASONE SODIUM PHOSPHATE 10 MG/ML IJ SOLN
INTRAMUSCULAR | Status: DC | PRN
  Administered 2018-06-09: 10 mg via INTRAVENOUS

## 2018-06-09 MED ORDER — OXYCODONE HCL 5 MG/5ML PO SOLN: 5.0000 mg | Freq: Once | ORAL | Status: DC | PRN

## 2018-06-09 MED ORDER — ONDANSETRON HCL 4 MG/2ML IJ SOLN
INTRAMUSCULAR | Status: DC | PRN
  Administered 2018-06-09: 4 mg via INTRAVENOUS

## 2018-06-09 MED ORDER — SUCCINYLCHOLINE CHLORIDE 200 MG/10ML IV SOSY
PREFILLED_SYRINGE | INTRAVENOUS | Status: DC | PRN
  Administered 2018-06-09: 120 mg via INTRAVENOUS

## 2018-06-09 SURGICAL SUPPLY — 31 items
BANDAGE COBAN STERILE 2 (GAUZE/BANDAGES/DRESSINGS) IMPLANT
CANISTER SUCT 1200ML W/VALVE (MISCELLANEOUS) ×3 IMPLANT
CATH ROBINSON RED A/P 12FR (CATHETERS) IMPLANT
COAGULATOR SUCT 6 FR SWTCH (ELECTROSURGICAL)
COAGULATOR SUCT SWTCH 10FR 6 (ELECTROSURGICAL) IMPLANT
COVER BACK TABLE 60X90IN (DRAPES) ×3 IMPLANT
COVER MAYO STAND STRL (DRAPES) ×3 IMPLANT
ELECT COATED BLADE 2.86 ST (ELECTRODE) ×3 IMPLANT
ELECT REM PT RETURN 9FT ADLT (ELECTROSURGICAL) ×3
ELECT REM PT RETURN 9FT PED (ELECTROSURGICAL)
ELECTRODE REM PT RETRN 9FT PED (ELECTROSURGICAL) IMPLANT
ELECTRODE REM PT RTRN 9FT ADLT (ELECTROSURGICAL) ×1 IMPLANT
GAUZE SPONGE 4X4 12PLY STRL LF (GAUZE/BANDAGES/DRESSINGS) ×3 IMPLANT
GLOVE BIOGEL PI IND STRL 7.0 (GLOVE) ×1 IMPLANT
GLOVE BIOGEL PI INDICATOR 7.0 (GLOVE) ×2
GLOVE ECLIPSE 6.5 STRL STRAW (GLOVE) ×3 IMPLANT
GLOVE SS BIOGEL STRL SZ 7.5 (GLOVE) ×1 IMPLANT
GLOVE SUPERSENSE BIOGEL SZ 7.5 (GLOVE) ×2
GOWN STRL REUS W/ TWL LRG LVL3 (GOWN DISPOSABLE) ×1 IMPLANT
GOWN STRL REUS W/TWL LRG LVL3 (GOWN DISPOSABLE) ×2
MARKER SKIN DUAL TIP RULER LAB (MISCELLANEOUS) IMPLANT
NS IRRIG 1000ML POUR BTL (IV SOLUTION) ×3 IMPLANT
PENCIL FOOT CONTROL (ELECTRODE) ×3 IMPLANT
SHEET MEDIUM DRAPE 40X70 STRL (DRAPES) ×3 IMPLANT
SOLUTION BUTLER CLEAR DIP (MISCELLANEOUS) IMPLANT
SPONGE TONSIL 1 RF SGL (DISPOSABLE) IMPLANT
SPONGE TONSIL TAPE 1.25 RFD (DISPOSABLE) IMPLANT
SYR BULB 3OZ (MISCELLANEOUS) ×3 IMPLANT
TOWEL GREEN STERILE FF (TOWEL DISPOSABLE) ×3 IMPLANT
TUBE CONNECTING 20'X1/4 (TUBING) ×1
TUBE CONNECTING 20X1/4 (TUBING) ×2 IMPLANT

## 2018-06-09 NOTE — Op Note (Signed)
NAMCamillia Herter: [name], [name] MEDICAL RECORD [id number] ACCOUNT [id number] DATE OF BIRTH:[birth year] FACILITY: [facility] LOCATION: [facility] PHYSICIAN:Dev Dhondt Braxton FeathersE. Nyellie Yetter, MD  OPERATIVE REPORT  DATE OF PROCEDURE:  06/09/2018  PREOPERATIVE DIAGNOSIS:  Recurrent tonsillitis with tonsillar hypertrophy.  POSTOPERATIVE DIAGNOSIS:  Recurrent tonsillitis with tonsillar hypertrophy.  OPERATION:  Tonsillectomy.  SURGEON:  Dillard Cannonhristopher Ladarrion Telfair, M.D.  ANESTHESIA:  General endotracheal.  COMPLICATIONS:  None.  BLOOD LOSS:  Minimal.  BRIEF CLINICAL NOTE:  The patient is a 22 year old gentleman who has had frequent tonsil infections.  He has been on 5 or 6 rounds of antibiotics this past year.  On exam, he has large 2-3+ size tonsils bilaterally.  He is taken to the operating room at  this time for tonsillectomy.  DESCRIPTION OF PROCEDURE:  After adequate endotracheal anesthesia, the patient received 10 mg of Decadron IV preoperatively as well as 2 grams of Ancef.  A mouth gag was used to expose the oropharynx.  The left and right tonsils were then resected from  the tonsil fossa using the cautery.  Care was taken to preserve the anterior and posterior tonsillar pillars as well as the uvula.  Hemostasis was obtained with cautery.  Oropharynx was irrigated with saline.  Nasopharynx was examined with a mirror and  patient had minimal adenoid tissue.  This completed the procedure.  The patient was subsequently awoken from anesthesia and transferred to recovery room without doing well.  DISPOSITION:  The patient is discharged home later this morning on azithromycin suspension one teaspoon daily for 6 days along with Tylenol, ibuprofen and hydrocodone elixir 2-3 teaspoons q.4 hours p.r.n. pain.  He will follow up in my office in 10-14  days for recheck.  TN/NUANCE  D:06/09/2018 T:06/09/2018 JOB:[id number]

## 2018-06-09 NOTE — Brief Op Note (Signed)
06/09/2018  8:33 AM  PATIENT:  Camillia HerterAaron Pund  22 y.o. male  PRE-OPERATIVE DIAGNOSIS:  CHRONIC TONSILITIS  POST-OPERATIVE DIAGNOSIS:  CHRONIC TONSILITIS  PROCEDURE:  Procedure(s): TONSILLECTOMY (N/A)  SURGEON:  Surgeon(s) and Role:    Drema Halon* Jennise Both E, MD - Primary  PHYSICIAN ASSISTANT:   ASSISTANTS: none   ANESTHESIA:   general  EBL:  5 mL   BLOOD ADMINISTERED:none  DRAINS: none   LOCAL MEDICATIONS USED:  NONE  SPECIMEN:  No Specimen  DISPOSITION OF SPECIMEN:  N/A  COUNTS:  YES  TOURNIQUET:  * No tourniquets in log *  DICTATION: .Other Dictation: Dictation Number [id number]  PLAN OF CARE: Discharge to home after PACU  PATIENT DISPOSITION:  PACU - hemodynamically stable.   Delay start of Pharmacological VTE agent (>24hrs) due to surgical blood loss or risk of bleeding: yes

## 2018-06-09 NOTE — Discharge Instructions (Addendum)
Instructions for Home Care After Tonsillectomy  First Day Home: Encourage fluid intake by frequently offering liquids, soup, ice cream jello, etc.  Drink several glasses of water.  Cooler fluids are best.  Avoid hot and highly seasoned foods.  Orange juice, grapefruit juice and tomato juice may cause stinging sensation because of their acidic content.    Second and Third Day Home: Continue liquids and add soft foods, (pudding, macaroni and cheese, mashed potatoes, soft scrambled eggs, etc.).  Make sure you drink plenty of liquids so you do not get dehydrated.  Fifth Thru Seventh Day Home: Gradually resume a normal diet, but avoid hot foods, potato chips, nuts, toast and crackers until 2 weeks after surgery.  General Instructions   No undue physical exertion or exercise for one week.  Children: Tylenol may be used for discomfort and/or fever.  Use as often as necessary within limits of the directions.  Adults: May spray throat with Chloroseptic or other topical anesthetic for discomfort and use pain medication obtained by prescription as directed.    A slight fever (up to 101) is expected for the first the first couple of days.  Take Tylenol (or aspirin substitute) as directed.  Pain in ears is common after tonsillectomy.  It represents pain referred from the throat where the tonsils were removed.  There is usually nothing wrong with the ears in most cases.  Administer Tylenol as needed to control this pain.  White patches will form where the tonsils were removed.  This is perfectly normal.  They will disappear in one to two weeks.  Mouth odor may be notice during the healing stage  In a very small percentage of people, there is some bleeding after five to six days.  If this happens, do not become excited, for the bleeding is usually light.  Be quiet, lie down, and spit the blood out gently.  Gargle the throat with ice water.  If the bleeding does not stop promptly, call the office  [phone]), which answers 24 hours a day.  A follow up appointment should be made with Dr. Ezzard StandingNewman 10-14 days following surgery. Please call [phone] for the appointment time.  Tylenol, ibuprofen, and or Hydrocodone elixir 10-15 cc every 6 hrs prn pain Zithromax 1 tsp (5cc) daily for the next 6 days    Post Anesthesia Home Care Instructions  Activity: Get plenty of rest for the remainder of the day. A responsible individual must stay with you for 24 hours following the procedure.  For the next 24 hours, DO NOT: -Drive a car -Advertising copywriterperate machinery -Drink alcoholic beverages -Take any medication unless instructed by your physician -Make any legal decisions or sign important papers.  Meals: Start with liquid foods such as gelatin or soup. Progress to regular foods as tolerated. Avoid greasy, spicy, heavy foods. If nausea and/or vomiting occur, drink only clear liquids until the nausea and/or vomiting subsides. Call your physician if vomiting continues.  Special Instructions/Symptoms: Your throat may feel dry or sore from the anesthesia or the breathing tube placed in your throat during surgery. If this causes discomfort, gargle with warm salt water. The discomfort should disappear within 24 hours.  If you had a scopolamine patch placed behind your ear for the management of post- operative nausea and/or vomiting:  1. The medication in the patch is effective for 72 hours, after which it should be removed.  Wrap patch in a tissue and discard in the trash. Wash hands thoroughly with soap and water. 2. You  may remove the patch earlier than 72 hours if you experience unpleasant side effects which may include dry mouth, dizziness or visual disturbances. 3. Avoid touching the patch. Wash your hands with soap and water after contact with the patch.

## 2018-06-09 NOTE — Transfer of Care (Signed)
Immediate Anesthesia Transfer of Care Note  Patient: Dylan [name]  Procedure(s) Performed: TONSILLECTOMY (N/A )  Patient Location: PACU  Anesthesia Type:General  Level of Consciousness: drowsy and patient cooperative  Airway & Oxygen Therapy: Patient Spontanous Breathing and Patient connected to face mask oxygen  Post-op Assessment: Report given to RN and Post -op Vital signs reviewed and stable  Post vital signs: Reviewed and stable  Last Vitals:  Vitals Value Taken Time  BP 166/89 06/09/2018  8:47 AM  Temp    Pulse 82 06/09/2018  8:50 AM  Resp 17 06/09/2018  8:50 AM  SpO2 100 % 06/09/2018  8:50 AM  Vitals shown include unvalidated device data.  Last Pain:  Vitals:   06/09/18 0628  TempSrc: Oral  PainSc: 0-No pain      Patients Stated Pain Goal: 0 (06/09/18 [id number])  Complications: No apparent anesthesia complications

## 2018-06-09 NOTE — Anesthesia Preprocedure Evaluation (Addendum)
Anesthesia Evaluation  Patient identified by MRN, date of birth, ID band Patient awake    Reviewed: Allergy & Precautions, NPO status , Patient's Chart, lab work & pertinent test results  Airway Mallampati: II  TM Distance: >3 FB Neck ROM: Full    Dental  (+) Dental Advisory Given, Teeth Intact   Pulmonary neg pulmonary ROS,    breath sounds clear to auscultation       Cardiovascular negative cardio ROS   Rhythm:Regular Rate:Normal     Neuro/Psych  Headaches, negative psych ROS   GI/Hepatic negative GI ROS, Neg liver ROS,   Endo/Other  negative endocrine ROS  Renal/GU negative Renal ROS  negative genitourinary   Musculoskeletal Gout   Abdominal   Peds  Hematology negative hematology ROS (+)   Anesthesia Other Findings   Reproductive/Obstetrics                            Anesthesia Physical Anesthesia Plan  ASA: II  Anesthesia Plan: General   Post-op Pain Management:    Induction: Intravenous  PONV Risk Score and Plan: 3 and Treatment may vary due to age or medical condition, Ondansetron, Dexamethasone and Midazolam  Airway Management Planned: Oral ETT  Additional Equipment: None  Intra-op Plan:   Post-operative Plan: Extubation in OR  Informed Consent: I have reviewed the patients History and Physical, chart, labs and discussed the procedure including the risks, benefits and alternatives for the proposed anesthesia with the patient or authorized representative who has indicated his/her understanding and acceptance.   Dental advisory given  Plan Discussed with: CRNA and Anesthesiologist  Anesthesia Plan Comments:         Anesthesia Quick Evaluation

## 2018-06-09 NOTE — Anesthesia Postprocedure Evaluation (Signed)
Anesthesia Post Note  Patient: Dylan [name]  Procedure(s) Performed: TONSILLECTOMY (N/A )     Patient location during evaluation: PACU Anesthesia Type: General Level of consciousness: awake and alert Pain management: pain level controlled Vital Signs Assessment: post-procedure vital signs reviewed and stable Respiratory status: spontaneous breathing, nonlabored ventilation and respiratory function stable Cardiovascular status: blood pressure returned to baseline and stable Postop Assessment: no apparent nausea or vomiting Anesthetic complications: no    Last Vitals:  Vitals:   06/09/18 1000 06/09/18 1025  BP: (!) 149/87 (!) 152/81  Pulse: 67 (!) 56  Resp: 16 18  Temp:  36.6 C  SpO2: 98% 100%    Last Pain:  Vitals:   06/09/18 1025  TempSrc:   PainSc: 4                  Dylan [name]

## 2018-06-09 NOTE — Anesthesia Procedure Notes (Signed)
Procedure Name: Intubation Date/Time: 06/09/2018 8:01 AM Performed by: Marny Lowensteinapozzi, Marquell Saenz W, CRNA Pre-anesthesia Checklist: Patient identified, Emergency Drugs available, Suction available, Patient being monitored and Timeout performed Patient Re-evaluated:Patient Re-evaluated prior to induction Oxygen Delivery Method: Circle system utilized Preoxygenation: Pre-oxygenation with 100% oxygen Induction Type: IV induction Ventilation: Mask ventilation without difficulty Laryngoscope Size: Miller and 2 Grade View: Grade I Tube type: Oral Tube size: 7.0 mm Number of attempts: 1 Placement Confirmation: ETT inserted through vocal cords under direct vision,  positive ETCO2,  CO2 detector and breath sounds checked- equal and bilateral Secured at: 22 cm Tube secured with: Tape Dental Injury: Teeth and Oropharynx as per pre-operative assessment

## 2018-06-09 NOTE — Interval H&P Note (Signed)
History and Physical Interval Note:  06/09/2018 7:48 AM  Dylan [name]  has presented today for surgery, with the diagnosis of CHRONIC TONSILITIS  The various methods of treatment have been discussed with the patient and family. After consideration of risks, benefits and other options for treatment, the patient has consented to  Procedure(s): TONSILLECTOMY (N/A) as a surgical intervention .  The patient's history has been reviewed, patient examined, no change in status, stable for surgery.  I have reviewed the patient's chart and labs.  Questions were answered to the patient's satisfaction.     Dillard Cannonhristopher Jovonta Levit

## 2018-06-10 ENCOUNTER — Encounter (HOSPITAL_BASED_OUTPATIENT_CLINIC_OR_DEPARTMENT_OTHER): Payer: Self-pay | Admitting: Otolaryngology

## 2018-06-11 ENCOUNTER — Encounter (HOSPITAL_COMMUNITY): Payer: Self-pay | Admitting: Emergency Medicine

## 2018-06-11 ENCOUNTER — Emergency Department (HOSPITAL_COMMUNITY): Payer: BC Managed Care – PPO | Admitting: Anesthesiology

## 2018-06-11 ENCOUNTER — Other Ambulatory Visit: Payer: Self-pay

## 2018-06-11 ENCOUNTER — Encounter (HOSPITAL_COMMUNITY)
Admission: EM | Disposition: A | Discharge status: Home / Self Care | Payer: Self-pay | Source: Home / Self Care | Attending: Emergency Medicine

## 2018-06-11 ENCOUNTER — Ambulatory Visit (HOSPITAL_COMMUNITY)
Admission: EM | Admit: 2018-06-11 | Discharge: 2018-06-12 | Disposition: A | Discharge status: Home / Self Care | Payer: BC Managed Care – PPO | Attending: Emergency Medicine | Admitting: Emergency Medicine

## 2018-06-11 DIAGNOSIS — Z881 Allergy status to other antibiotic agents status: Secondary | ICD-10-CM | POA: Insufficient documentation

## 2018-06-11 DIAGNOSIS — J95830 Postprocedural hemorrhage and hematoma of a respiratory system organ or structure following a respiratory system procedure: Secondary | ICD-10-CM | POA: Diagnosis not present

## 2018-06-11 DIAGNOSIS — Z8249 Family history of ischemic heart disease and other diseases of the circulatory system: Secondary | ICD-10-CM | POA: Insufficient documentation

## 2018-06-11 DIAGNOSIS — Z9089 Acquired absence of other organs: Secondary | ICD-10-CM

## 2018-06-11 HISTORY — PX: TONSILLECTOMY AND ADENOIDECTOMY: SHX28

## 2018-06-11 LAB — CBC WITH DIFFERENTIAL/PLATELET
Abs Immature Granulocytes: 0.1 10*3/uL (ref 0.0–0.1)
BASOS ABS: 0 10*3/uL (ref 0.0–0.1)
BASOS PCT: 0 %
EOS ABS: 0 10*3/uL (ref 0.0–0.7)
EOS PCT: 0 %
HCT: 45.8 % (ref 39.0–52.0)
Hemoglobin: 15.2 g/dL (ref 13.0–17.0)
Immature Granulocytes: 0 %
LYMPHS ABS: 1.5 10*3/uL (ref 0.7–4.0)
Lymphocytes Relative: 10 %
MCH: 32.4 pg (ref 26.0–34.0)
MCHC: 33.2 g/dL (ref 30.0–36.0)
MCV: 97.7 fL (ref 78.0–100.0)
Monocytes Absolute: 1 10*3/uL (ref 0.1–1.0)
Monocytes Relative: 7 %
NEUTROS PCT: 83 %
Neutro Abs: 12.8 10*3/uL — ABNORMAL HIGH (ref 1.7–7.7)
PLATELETS: 200 10*3/uL (ref 150–400)
RBC: 4.69 MIL/uL (ref 4.22–5.81)
RDW: 11.7 % (ref 11.5–15.5)
WBC: 15.5 10*3/uL — AB (ref 4.0–10.5)

## 2018-06-11 LAB — ABO/RH: ABO/RH(D): O POS

## 2018-06-11 LAB — PROTIME-INR
INR: 0.99
PROTHROMBIN TIME: 13 s (ref 11.4–15.2)

## 2018-06-11 LAB — TYPE AND SCREEN
ABO/RH(D): O POS
ANTIBODY SCREEN: NEGATIVE

## 2018-06-11 SURGERY — TONSILLECTOMY AND ADENOIDECTOMY
Anesthesia: General | Site: Throat

## 2018-06-11 MED ORDER — CEFAZOLIN SODIUM-DEXTROSE 2-4 GM/100ML-% IV SOLN
INTRAVENOUS | Status: AC
  Filled 2018-06-11: qty 100

## 2018-06-11 MED ORDER — CEFAZOLIN SODIUM-DEXTROSE 2-3 GM-%(50ML) IV SOLR
INTRAVENOUS | Status: DC | PRN
  Administered 2018-06-11: 2 g via INTRAVENOUS

## 2018-06-11 MED ORDER — LIDOCAINE HCL (CARDIAC) PF 100 MG/5ML IV SOSY
PREFILLED_SYRINGE | INTRAVENOUS | Status: DC | PRN
  Administered 2018-06-11: 100 mg via INTRATRACHEAL

## 2018-06-11 MED ORDER — FENTANYL CITRATE (PF) 250 MCG/5ML IJ SOLN
INTRAMUSCULAR | Status: AC
  Filled 2018-06-11: qty 5

## 2018-06-11 MED ORDER — FENTANYL CITRATE (PF) 250 MCG/5ML IJ SOLN
INTRAMUSCULAR | Status: DC | PRN
  Administered 2018-06-11: 100 ug via INTRAVENOUS

## 2018-06-11 MED ORDER — ONDANSETRON HCL 4 MG/2ML IJ SOLN
INTRAMUSCULAR | Status: DC | PRN
  Administered 2018-06-11: 4 mg via INTRAVENOUS

## 2018-06-11 MED ORDER — DEXAMETHASONE SODIUM PHOSPHATE 10 MG/ML IJ SOLN
INTRAMUSCULAR | Status: DC | PRN
  Administered 2018-06-11: 10 mg via INTRAVENOUS

## 2018-06-11 MED ORDER — PROPOFOL 10 MG/ML IV BOLUS
INTRAVENOUS | Status: DC | PRN
  Administered 2018-06-11: 200 mg via INTRAVENOUS

## 2018-06-11 MED ORDER — PROMETHAZINE HCL 25 MG/ML IJ SOLN
6.2500 mg | INTRAMUSCULAR | Status: DC | PRN
  Administered 2018-06-12: 6.25 mg via INTRAVENOUS

## 2018-06-11 MED ORDER — LACTATED RINGERS IV SOLN
INTRAVENOUS | Status: DC | PRN
  Administered 2018-06-11: 23:00:00 via INTRAVENOUS

## 2018-06-11 MED ORDER — 0.9 % SODIUM CHLORIDE (POUR BTL) OPTIME
TOPICAL | Status: DC | PRN
  Administered 2018-06-11: 500 mL

## 2018-06-11 MED ORDER — MEPERIDINE HCL 50 MG/ML IJ SOLN: 6.2500 mg | INTRAMUSCULAR | Status: DC | PRN

## 2018-06-11 MED ORDER — PROPOFOL 10 MG/ML IV BOLUS
INTRAVENOUS | Status: AC
  Filled 2018-06-11: qty 40

## 2018-06-11 MED ORDER — MIDAZOLAM HCL 2 MG/2ML IJ SOLN: 0.5000 mg | Freq: Once | INTRAMUSCULAR | Status: DC | PRN

## 2018-06-11 MED ORDER — MIDAZOLAM HCL 2 MG/2ML IJ SOLN
INTRAMUSCULAR | Status: AC
  Filled 2018-06-11: qty 2

## 2018-06-11 MED ORDER — SUCCINYLCHOLINE CHLORIDE 20 MG/ML IJ SOLN
INTRAMUSCULAR | Status: DC | PRN
  Administered 2018-06-11: 120 mg via INTRAVENOUS

## 2018-06-11 MED ORDER — FENTANYL CITRATE (PF) 100 MCG/2ML IJ SOLN: 25.0000 ug | INTRAMUSCULAR | Status: DC | PRN

## 2018-06-11 MED ORDER — MIDAZOLAM HCL 5 MG/5ML IJ SOLN
INTRAMUSCULAR | Status: DC | PRN
  Administered 2018-06-11 (×2): 1 mg via INTRAVENOUS

## 2018-06-11 SURGICAL SUPPLY — 34 items
BLADE SURG 15 STRL LF DISP TIS (BLADE) ×1 IMPLANT
BLADE SURG 15 STRL SS (BLADE) ×2
CANISTER SUCT 3000ML PPV (MISCELLANEOUS) ×3 IMPLANT
CATH ROBINSON RED A/P 12FR (CATHETERS) IMPLANT
COAGULATOR SUCT 6 FR SWTCH (ELECTROSURGICAL) ×1
COAGULATOR SUCT SWTCH 10FR 6 (ELECTROSURGICAL) ×2 IMPLANT
DRAPE HALF SHEET 40X57 (DRAPES) IMPLANT
ELECT COATED BLADE 2.86 ST (ELECTRODE) ×3 IMPLANT
ELECT REM PT RETURN 9FT ADLT (ELECTROSURGICAL) ×3
ELECT REM PT RETURN 9FT PED (ELECTROSURGICAL)
ELECTRODE REM PT RETRN 9FT PED (ELECTROSURGICAL) IMPLANT
ELECTRODE REM PT RTRN 9FT ADLT (ELECTROSURGICAL) ×1 IMPLANT
GAUZE SPONGE 4X4 16PLY XRAY LF (GAUZE/BANDAGES/DRESSINGS) ×3 IMPLANT
GLOVE SS BIOGEL STRL SZ 7.5 (GLOVE) ×1 IMPLANT
GLOVE SUPERSENSE BIOGEL SZ 7.5 (GLOVE) ×2
GOWN STRL REUS W/ TWL LRG LVL3 (GOWN DISPOSABLE) ×1 IMPLANT
GOWN STRL REUS W/ TWL XL LVL3 (GOWN DISPOSABLE) ×1 IMPLANT
GOWN STRL REUS W/TWL LRG LVL3 (GOWN DISPOSABLE) ×2
GOWN STRL REUS W/TWL XL LVL3 (GOWN DISPOSABLE) ×2
KIT BASIN OR (CUSTOM PROCEDURE TRAY) ×3 IMPLANT
KIT TURNOVER KIT B (KITS) ×3 IMPLANT
NEEDLE HYPO 25GX1X1/2 BEV (NEEDLE) IMPLANT
NS IRRIG 1000ML POUR BTL (IV SOLUTION) ×3 IMPLANT
PACK SURGICAL SETUP 50X90 (CUSTOM PROCEDURE TRAY) ×3 IMPLANT
PAD ARMBOARD 7.5X6 YLW CONV (MISCELLANEOUS) ×3 IMPLANT
PENCIL FOOT CONTROL (ELECTRODE) ×3 IMPLANT
SPECIMEN JAR SMALL (MISCELLANEOUS) IMPLANT
SPONGE TONSIL 1 RF SGL (DISPOSABLE) ×3 IMPLANT
SYR BULB 3OZ (MISCELLANEOUS) ×3 IMPLANT
TOWEL OR 17X24 6PK STRL BLUE (TOWEL DISPOSABLE) ×6 IMPLANT
TUBE CONNECTING 12'X1/4 (SUCTIONS) ×1
TUBE CONNECTING 12X1/4 (SUCTIONS) ×2 IMPLANT
TUBE SALEM SUMP 12R W/ARV (TUBING) ×3 IMPLANT
WATER STERILE IRR 1000ML POUR (IV SOLUTION) ×3 IMPLANT

## 2018-06-11 NOTE — Anesthesia Preprocedure Evaluation (Addendum)
Anesthesia Evaluation  Patient identified by MRN, date of birth, ID band Patient awake    Reviewed: Allergy & Precautions, NPO status , Patient's Chart, lab work & pertinent test results  History of Anesthesia Complications Negative for: history of anesthetic complications  Airway Mallampati: II  TM Distance: >3 FB Neck ROM: Full    Dental  (+) Dental Advisory Given   Pulmonary neg pulmonary ROS,    breath sounds clear to auscultation       Cardiovascular negative cardio ROS   Rhythm:Regular Rate:Normal     Neuro/Psych  Headaches,    GI/Hepatic negative GI ROS, Neg liver ROS,   Endo/Other  negative endocrine ROS  Renal/GU negative Renal ROS     Musculoskeletal   Abdominal   Peds  Hematology   Anesthesia Other Findings Post-tonsillectomy bleeding  Reproductive/Obstetrics                            Anesthesia Physical Anesthesia Plan  ASA: II and emergent  Anesthesia Plan: General   Post-op Pain Management:    Induction: Rapid sequence, Intravenous and Cricoid pressure planned  PONV Risk Score and Plan: 2 and Ondansetron and Dexamethasone  Airway Management Planned: Oral ETT  Additional Equipment:   Intra-op Plan:   Post-operative Plan: Extubation in OR  Informed Consent: I have reviewed the patients History and Physical, chart, labs and discussed the procedure including the risks, benefits and alternatives for the proposed anesthesia with the patient or authorized representative who has indicated his/her understanding and acceptance.   Dental advisory given  Plan Discussed with: CRNA, Surgeon and Anesthesiologist  Anesthesia Plan Comments: (Plan routine monitors, GETA )     Anesthesia Quick Evaluation

## 2018-06-11 NOTE — Consult Note (Signed)
Reason for Consult:Oral bleeding Referring Physician: [place]  Alvin Omeara is an 22 y.o. male.  HPI: Patient s/p tonsillectomy 2 days ago at [facility].He did well until this evening when he started bleeding. He was instructed to go to the ER if it didn't stop in 5 minutes. He's continued to bleed and presents to the ER.  Past Medical History:  Diagnosis Date  . Gout    left great toe  . Headache    migraines    Past Surgical History:  Procedure Laterality Date  . TONSILLECTOMY N/A 06/09/2018   Procedure: TONSILLECTOMY;  Surgeon: Zaylyn Bergdoll E, MD;  Location: [facility];  Service: ENT;  Laterality: N/A;    Social History:  reports that he has never smoked. He has never used smokeless tobacco. He reports that he drinks alcohol. He reports that he does not use drugs.  Allergies:  Allergies  Allergen Reactions  . Augmentin [Amoxicillin-Pot Clavulanate] Rash    Mouth and neck rash   . Clindamycin/Lincomycin Hives    generalized    Medications: I have reviewed the patient's current medications.  No results found for this or any previous visit (from the past 48 hour(s)).  No results found.  ROS:per HPI   PE:Awake and alert without airway problems. He has blood clot in mouth and has active bleeding presently. It appears to be bleeding from the left side. Lungs clear.  Assessment/Plan: Post tonsillectomy bleeding. To Or for cauterization under general anesthesia  Atira Borello 06/11/2018, 10:22 PM     

## 2018-06-11 NOTE — Interval H&P Note (Signed)
History and Physical Interval Note:  06/11/2018 10:46 PM  Dylan HerterAaron Winch  has presented today for surgery, with the diagnosis of post tonsillar bleed  The various methods of treatment have been discussed with the patient and family. After consideration of risks, benefits and other options for treatment, the patient has consented to  Procedure(s): POST TONSILLAR BLEED (N/A) as a surgical intervention .  The patient's history has been reviewed, patient examined, no change in status, stable for surgery.  I have reviewed the patient's chart and labs.  Questions were answered to the patient's satisfaction.     [name]

## 2018-06-11 NOTE — Discharge Instructions (Signed)
Continue with present medications. Stay on a liquid diet for the next 3 days, Contact Dr Ezzard StandingNewman if you have any further bleeding  [phone]

## 2018-06-11 NOTE — H&P (View-Only) (Signed)
Reason for Consult:Oral bleeding Referring Physician: [place]  Dylan [name] is an 22 y.o. male.  HPI: [facility]He did well until this evening when he started bleeding. He was instructed to go to the ER if it didn't stop in 5 minutes. He's continued to bleed and presents to the ER.  Past Medical History:  Diagnosis Date  . Gout    left great toe  . Headache    migraines    Past Surgical History:  Procedure Laterality Date  . TONSILLECTOMY N/A 06/09/2018   Procedure: TONSILLECTOMY;  Surgeon: Drema HalonNewman, Juliani Laduke E, MD;  Location: [facility];  Service: ENT;  Laterality: N/A;    Social History:  reports that he has never smoked. He has never used smokeless tobacco. He reports that he drinks alcohol. He reports that he does not use drugs.  Allergies:  Allergies  Allergen Reactions  . Augmentin [Amoxicillin-Pot Clavulanate] Rash    Mouth and neck rash   . Clindamycin/Lincomycin Hives    generalized    Medications: I have reviewed the patient's current medications.  No results found for this or any previous visit (from the past 48 hour(s)).  No results found.  ROS:per HPI   ZO:XWRUEPE:Awake and alert without airway problems. He has blood clot in mouth and has active bleeding presently. It appears to be bleeding from the left side. Lungs clear.  Assessment/Plan: Post tonsillectomy bleeding. To Or for cauterization under general anesthesia  Dillard CannonChristopher Yoni Lobos 06/11/2018, 10:22 PM

## 2018-06-11 NOTE — Brief Op Note (Signed)
06/11/2018  11:35 PM  PATIENT:  Dylan [name]  22 y.o. male  PRE-OPERATIVE DIAGNOSIS:  post tonsillar bleed  POST-OPERATIVE DIAGNOSIS:  post tonsillar bleed  PROCEDURE:  Procedure(s): POST TONSILLAR BLEED (N/A)Cauterization of left tonsillar fossa  SURGEON:  Surgeon(s) and Role:    Drema Halon* Carmelita Amparo E, MD - Primary  PHYSICIAN ASSISTANT:   ASSISTANTS: none   ANESTHESIA:   none  EBL:  100 mL   BLOOD ADMINISTERED:none  DRAINS: none   LOCAL MEDICATIONS USED:  NONE  SPECIMEN:  No Specimen  DISPOSITION OF SPECIMEN:  N/A  COUNTS:  YES  TOURNIQUET:  * No tourniquets in log *  DICTATION: .Other Dictation: Dictation Number [id number]  PLAN OF CARE: Discharge to home after PACU  PATIENT DISPOSITION:  PACU - hemodynamically stable.   Delay start of Pharmacological VTE agent (>24hrs) due to surgical blood loss or risk of bleeding: yes

## 2018-06-11 NOTE — ED Provider Notes (Signed)
[facility] Provider Note   CSN: [id number] Arrival date & time: 06/11/18  2140  History   Chief Complaint Chief Complaint  Patient presents with  . Post-op Problem    HPI Camillia Herteraron Pitera is a 22 y.o. male.  The history is provided by the patient.   22 yo M with PMHx of recurrently tonsillitis s/p b/l tonsillectomy 2 days ago with Dr.Newman who presents with sudden onset bleed from posterior oropharynx 2 hours PTA. Persistent. States scabs came off. Gargling water does not resolve pain. Accompanied by difficulty speaking. Denies epistaxis, N/V. Denies lightheadedness, CP, dyspnea.  Past Medical History:  Diagnosis Date  . Gout    left great toe  . Headache    migraines    There are no active problems to display for this patient.   Past Surgical History:  Procedure Laterality Date  . TONSILLECTOMY N/A 06/09/2018   Procedure: TONSILLECTOMY;  Surgeon: Drema HalonNewman, Christopher E, MD;  Location: [facility];  Service: ENT;  Laterality: N/A;        Home Medications    Prior to Admission medications   Medication Sig Start Date End Date Taking? Authorizing Provider  Aspirin-Caffeine 845-65 MG PACK Take by mouth.    [provider]  azithromycin (ZITHROMAX) 200 MG/5ML suspension Take 5 mLs (200 mg total) by mouth daily for 6 days. 06/09/18 06/15/18  Drema HalonNewman, Christopher E, MD  HYDROcodone-acetaminophen (HYCET) 7.5-325 mg/15 ml solution Take 10-15 mLs by mouth every 6 (six) hours as needed for moderate pain. 06/09/18 06/09/19  Drema HalonNewman, Christopher E, MD    Family History Family History  Problem Relation Age of Onset  . Hypertension Mother   . Diabetes Mother     Social History Social History   Tobacco Use  . Smoking status: Never Smoker  . Smokeless tobacco: Never Used  Substance Use Topics  . Alcohol use: Yes    Comment: Occassionally  . Drug use: No     Allergies   Augmentin [amoxicillin-pot clavulanate] and  Clindamycin/lincomycin   Review of Systems Review of Systems  Constitutional: Negative for chills and fever.  HENT: Positive for trouble swallowing. Negative for ear pain and sore throat.        Bleeding from post-op spot  Respiratory: Negative for cough and shortness of breath.   Cardiovascular: Negative for chest pain and palpitations.  Gastrointestinal: Negative for abdominal pain and vomiting.  Skin: Negative for color change, pallor and rash.  Neurological: Negative for syncope and light-headedness.  All other systems reviewed and are negative.    Physical Exam Updated Vital Signs BP (!) 149/93   Pulse 76   Temp 100 F (37.8 C) (Oral)   Resp 18   SpO2 98%   Physical Exam  Constitutional: He appears well-developed and well-nourished.  HENT:  Head: Normocephalic and atraumatic.  Bright red congealed blood to posterior oropharynx (L > R), pt spitting blood into emesis basin, muffled voice  Eyes: Conjunctivae and EOM are normal.  Neck: Neck supple.  Cardiovascular: Normal rate and regular rhythm.  No murmur heard. Pulmonary/Chest: Effort normal and breath sounds normal. No respiratory distress.  Neurological: He is alert.  Skin: Skin is warm and dry.  Psychiatric: He has a normal mood and affect.  Vitals reviewed.    ED Treatments / Results  Labs (all labs ordered are listed, but only abnormal results are displayed) Labs Reviewed  CBC WITH DIFFERENTIAL/PLATELET - Abnormal; Notable for the following components:  Result Value   WBC 15.5 (*)    Neutro Abs 12.8 (*)    All other components within normal limits  PROTIME-INR  TYPE AND SCREEN    EKG None  Radiology No results found.  Procedures Procedures (including critical care time)  Medications Ordered in ED Medications  ceFAZolin (ANCEF) 2-4 GM/100ML-% IVPB (has no administration in time range)     Initial Impression / Assessment and Plan / ED Course  I have reviewed the triage vital signs and  the nursing notes.  Pertinent labs & imaging results that were available during my care of the patient were reviewed by me and considered in my medical decision making (see chart for details).     Aniketh Huberty is a 22 y.o. male with PMHx of tonsillectomy 2 days ago who p/w bleeding from posterior oropharynx tonight. Reviewed and confirmed nursing documentation for past medical history, family history, social history. VS afebrile, HDS. Exam remarkable for blood pooling in posterior oropharynx.  ENT at bedside shortly after arrival, took patient to OR. CBC, INR, T&S pending at time of transfer to OR. Old records reviewed. HDS at time of transfer to OR  Final Clinical Impressions(s) / ED Diagnoses   Final diagnoses:  Status post tonsillectomy and adenoidectomy     Diannia Ruder, MD 06/11/18 2312    Rolland Porter, MD 06/12/18 [phone]

## 2018-06-11 NOTE — Anesthesia Procedure Notes (Signed)
Procedure Name: Intubation Date/Time: 06/11/2018 11:13 PM Performed by: Claudina LickMahony, Mylz Yuan D, CRNA Pre-anesthesia Checklist: Patient identified, Emergency Drugs available, Suction available, Patient being monitored and Timeout performed Patient Re-evaluated:Patient Re-evaluated prior to induction Oxygen Delivery Method: Circle system utilized Preoxygenation: Pre-oxygenation with 100% oxygen Induction Type: IV induction, Rapid sequence and Cricoid Pressure applied Laryngoscope Size: Miller and 2 Grade View: Grade I Tube type: Oral Tube size: 7.5 mm Number of attempts: 1 Airway Equipment and Method: Stylet Placement Confirmation: positive ETCO2,  breath sounds checked- equal and bilateral and ETT inserted through vocal cords under direct vision Secured at: 23 cm Tube secured with: Tape Dental Injury: Teeth and Oropharynx as per pre-operative assessment

## 2018-06-11 NOTE — Transfer of Care (Signed)
Immediate Anesthesia Transfer of Care Note  Patient: Dylan [name]  Procedure(s) Performed: POST TONSILLAR BLEED (N/A Throat)  Patient Location: PACU  Anesthesia Type:General  Level of Consciousness: awake  Airway & Oxygen Therapy: Patient Spontanous Breathing  Post-op Assessment: Report given to RN and Post -op Vital signs reviewed and stable  Post vital signs: Reviewed and stable  Last Vitals:  Vitals Value Taken Time  BP 151/82 06/11/2018 11:49 PM  Temp    Pulse 92 06/11/2018 11:50 PM  Resp 15 06/11/2018 11:50 PM  SpO2 99 % 06/11/2018 11:50 PM  Vitals shown include unvalidated device data.  Last Pain:  Vitals:   06/11/18 2151  TempSrc:   PainSc: 8          Complications: No apparent anesthesia complications

## 2018-06-11 NOTE — ED Triage Notes (Signed)
Pt reports having tonsillectomy on Tuesday. Pt reports today the scabs fell off and he has been bleeding continuously. Pt reports gargling cold ice water and the bleeding continues. Pt is still actively bleeding. Pt alert unable to speak. VSS.

## 2018-06-12 ENCOUNTER — Encounter (HOSPITAL_COMMUNITY): Payer: Self-pay | Admitting: Otolaryngology

## 2018-06-12 MED ORDER — PROMETHAZINE HCL 25 MG/ML IJ SOLN
INTRAMUSCULAR | Status: AC
  Filled 2018-06-12: qty 1

## 2018-06-12 NOTE — Anesthesia Postprocedure Evaluation (Signed)
Anesthesia Post Note  Patient: Camillia Herteraron Mantell  Procedure(s) Performed: POST TONSILLAR BLEED (N/A Throat)     Patient location during evaluation: PACU Anesthesia Type: General Level of consciousness: awake and alert, oriented and patient cooperative Pain management: pain level controlled Vital Signs Assessment: post-procedure vital signs reviewed and stable Respiratory status: spontaneous breathing, nonlabored ventilation, respiratory function stable and patient connected to nasal cannula oxygen Cardiovascular status: blood pressure returned to baseline and stable Postop Assessment: no apparent nausea or vomiting Anesthetic complications: no    Last Vitals:  Vitals:   06/12/18 0050 06/12/18 0052  BP: (!) 154/80 (!) 145/79  Pulse: 74 68  Resp: (!) 22 18  Temp:  36.5 C  SpO2: 99% 99%    Last Pain:  Vitals:   06/12/18 0049  TempSrc:   PainSc: 1                  Arty Lantzy,E. [place] Cou[place]and

## 2018-06-12 NOTE — Op Note (Signed)
NAMCamillia Herter: [name], Dylan [name] MEDICAL RECORD [id number] ACCOUNT [id number] DATE OF BIRTH:1996-11-29 FACILITY: [facility] LOCATION: [facility] PHYSICIAN: Braxton FeathersE. , MD  OPERATIVE REPORT  DATE OF PROCEDURE:  06/11/2018  PREOPERATIVE DIAGNOSIS:  Post-tonsillectomy hemorrhage.  POSTOPERATIVE DIAGNOSIS:  Post-tonsillectomy hemorrhage from left tonsil fossa.    OPERATION:  Oral examination with cauterization of post-tonsillar bleed at the left tonsillar fossa.  SURGEON:  Dillard Cannonhristopher , MD.  ANESTHESIA:  General endotracheal.  COMPLICATIONS:  None.  ESTIMATED BLOOD LOSS:  50 to 100 mL while in the OR.  BRIEF CLINICAL NOTE:  The patient is a 22 year old gentleman who [facility].  He did well up until this evening when he started having some bleeding around 8:30 tonight.  He tried gargling with cold  water, but continues to have bleeding mostly from what he thought was the left side.  He was subsequently seen in the emergency room after calling me and referring him to go to the ER.  In the ER, he was continued to have some bleeding as well as a large  amount of blood clot in his mouth.  On quick evaluation in the emergency room, he had a large amount of blood clot and apparent bleeding from the left tonsillar fossa region.  He was emergently taken up to the operating room for anesthesia and  cauterization under anesthesia.  DESCRIPTION OF PROCEDURE:  The patient was brought to the operating room and underwent general endotracheal anesthesia without difficult intubation.  He had a large amount of blood clot in his mouth and throat that was removed with a large suction.  He  had a fairly brisk bleeding from the mid left tonsil fossa region.  A sponge was placed here to help stop the bleeding.  This was removed and then suction cautery was used to cauterize the arterial bleeding from the mid left tonsil fossa.  Some of the  scabs were removed.   They had a little bit of oozing around some other spots that were also cauterized with suction cautery.  No obvious bleeding noted from the right tonsil fossa.  After controlling the hemorrhage, the oropharynx was irrigated with  saline.  An orogastric tube was passed into the stomach and a large amount of blood clot as well as some fresh blood was aspirated from the stomach, probably 200 mL.  Probable estimated blood loss was in the ballpark of 400 mL.  Overall, his preoperative  hemoglobin and hematocrit were 15 and 45.  The patient received 10 of Decadron and 2 grams of Ancef IV preoperatively.  After recovery in the PACU, the patient will be discharged back to home.  He will continue on his antibiotic and pain medicine.  He  was instructed to stay on a liquid diet for the next three days.  TN/NUANCE  D:06/11/2018 T:06/12/2018 JOB:[id number]

## 2018-06-12 NOTE — Progress Notes (Signed)
Went over discharge instructions with patient and family. Patient or family had any question at the present time.

## 2018-06-22 ENCOUNTER — Encounter (HOSPITAL_COMMUNITY): Payer: Self-pay | Admitting: Otolaryngology

## 2018-09-13 IMAGING — DX DG FOOT COMPLETE 3+V*L*
3 series · 3 of 3 positions shown · non-contrast
Comparison: None.

CLINICAL DATA: Pain and swelling in the great toe

EXAM:
LEFT FOOT - COMPLETE 3+ VIEW

[foot ap]
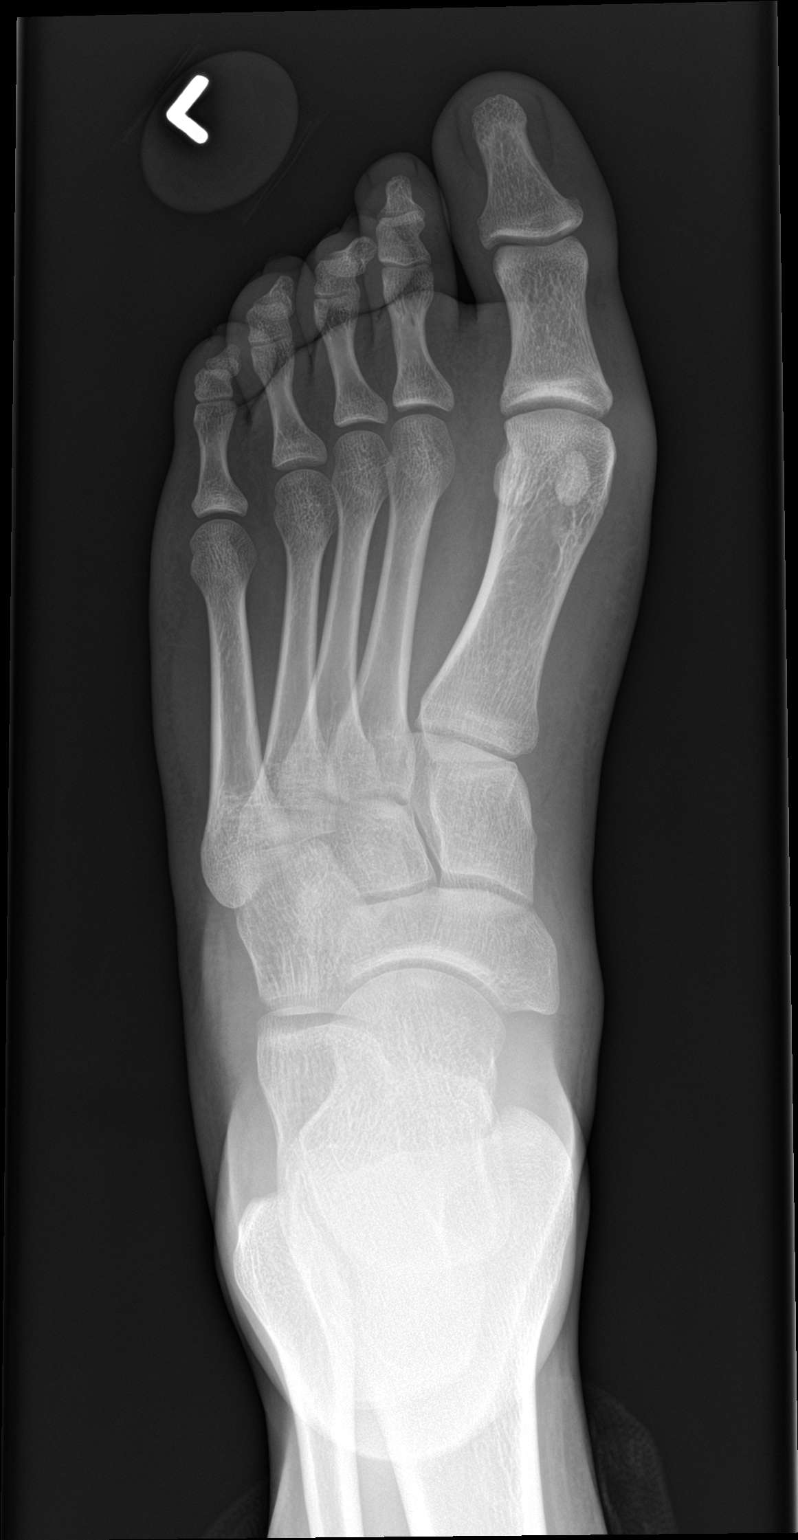

[foot obl]
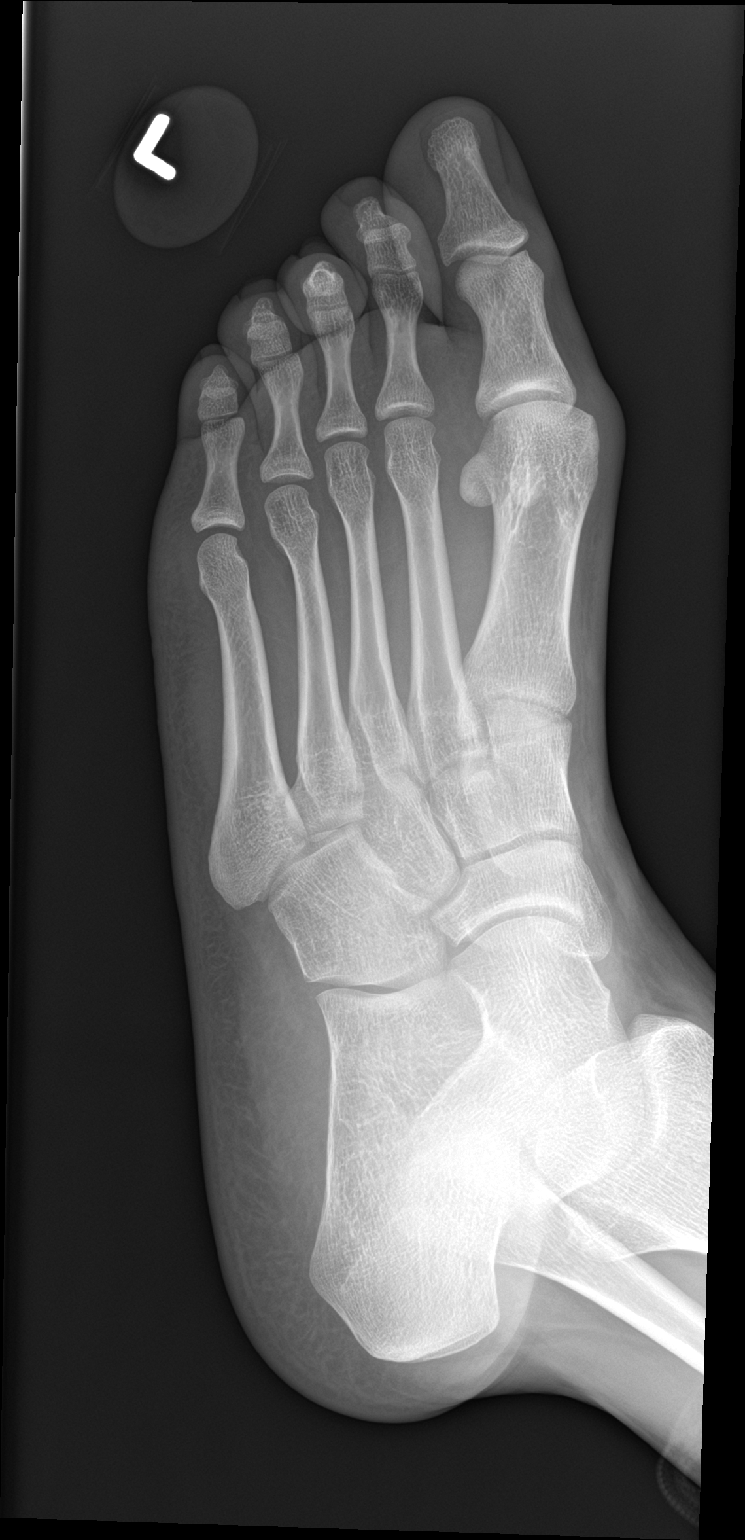

[foot lat]
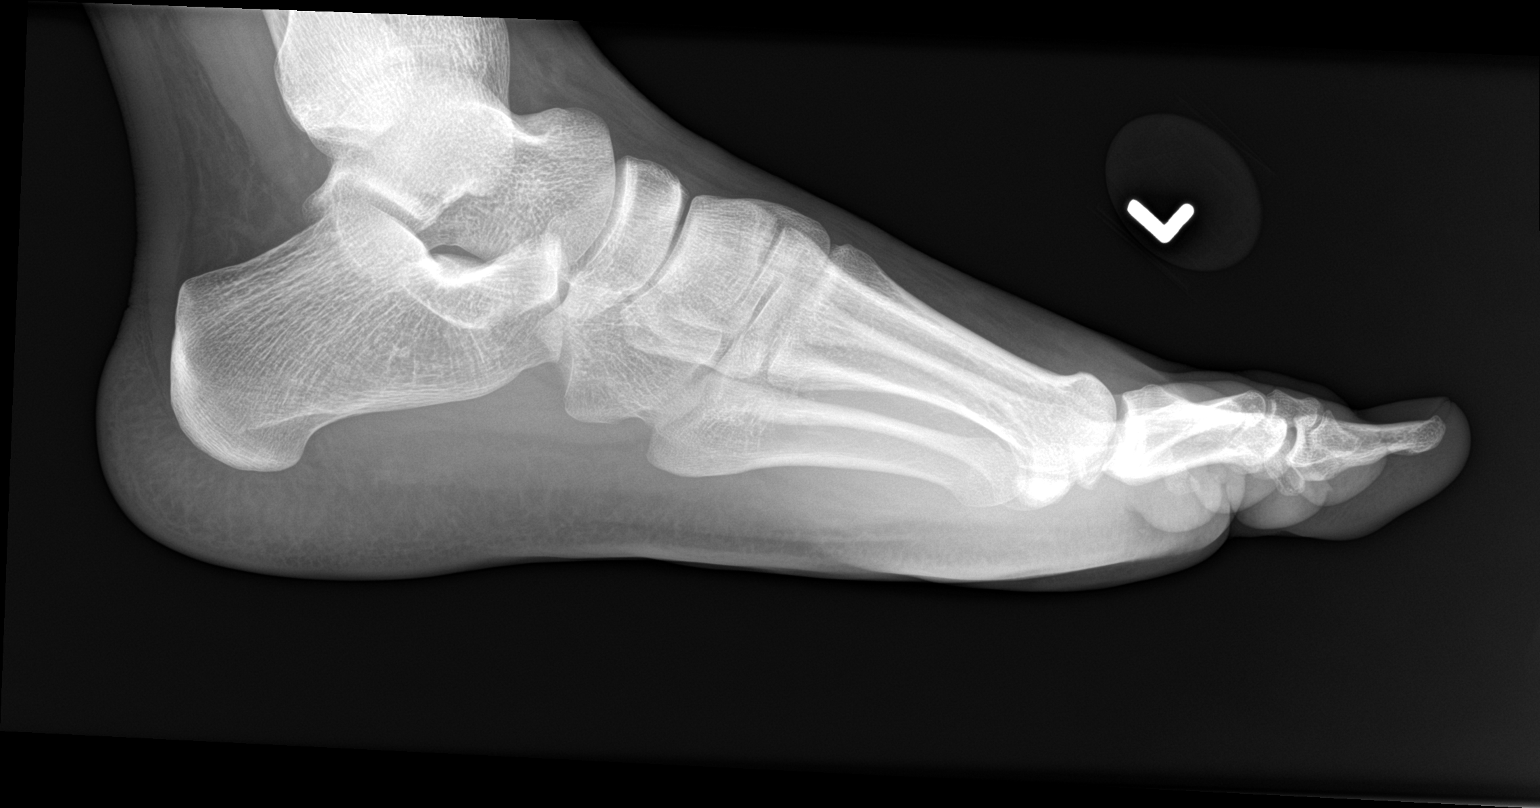

[3 of 3 positions shown; findings below may reference images not displayed]

FINDINGS: No fracture or malalignment. No periostitis or bone destruction.
Soft tissue edema.
IMPRESSION: Soft tissue swelling.  No definite acute osseous abnormality.
